# Patient Record
Sex: Male | Born: 2000 | Race: Black or African American | Hispanic: No | Marital: Single | Smoking: Never smoker
Health system: Southern US, Community
[De-identification: ages and names within clinical notes are randomized; demographics above are authoritative.]

## PROBLEM LIST (undated history)

## (undated) DIAGNOSIS — R51 Headache: Secondary | ICD-10-CM

## (undated) DIAGNOSIS — I517 Cardiomegaly: Secondary | ICD-10-CM

## (undated) DIAGNOSIS — R519 Headache, unspecified: Secondary | ICD-10-CM

## (undated) HISTORY — PX: NO PAST SURGERIES: SHX2092

---
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
## 2022-04-20 ENCOUNTER — Emergency Department: Payer: Medicaid Other

## 2022-04-20 ENCOUNTER — Other Ambulatory Visit: Payer: Self-pay

## 2022-04-20 ENCOUNTER — Inpatient Hospital Stay
Admission: EM | Admit: 2022-04-20 | Discharge: 2022-04-22 | DRG: 144 | Disposition: A | Discharge status: Home / Self Care | Payer: Medicaid Other | Attending: Internal Medicine | Admitting: Internal Medicine

## 2022-04-20 DIAGNOSIS — Z9889 Other specified postprocedural states: Secondary | ICD-10-CM

## 2022-04-20 DIAGNOSIS — J029 Acute pharyngitis, unspecified: Secondary | ICD-10-CM

## 2022-04-20 DIAGNOSIS — E860 Dehydration: Secondary | ICD-10-CM | POA: Diagnosis present

## 2022-04-20 DIAGNOSIS — R55 Syncope and collapse: Secondary | ICD-10-CM | POA: Diagnosis present

## 2022-04-20 DIAGNOSIS — J36 Peritonsillar abscess: Principal | ICD-10-CM | POA: Diagnosis present

## 2022-04-20 DIAGNOSIS — E871 Hypo-osmolality and hyponatremia: Secondary | ICD-10-CM | POA: Diagnosis present

## 2022-04-20 DIAGNOSIS — I422 Other hypertrophic cardiomyopathy: Secondary | ICD-10-CM | POA: Diagnosis present

## 2022-04-20 DIAGNOSIS — I421 Obstructive hypertrophic cardiomyopathy: Secondary | ICD-10-CM | POA: Diagnosis present

## 2022-04-20 DIAGNOSIS — I517 Cardiomegaly: Secondary | ICD-10-CM | POA: Diagnosis present

## 2022-04-20 LAB — COMPREHENSIVE METABOLIC PANEL
ALT: 10 U/L (ref 0–44)
AST: 24 U/L (ref 15–41)
Albumin: 3.5 g/dL (ref 3.5–5.0)
Alkaline Phosphatase: 53 U/L (ref 38–126)
Anion gap: 9 (ref 5–15)
BUN: 16 mg/dL (ref 6–20)
CO2: 21 mmol/L — ABNORMAL LOW (ref 22–32)
Calcium: 8.4 mg/dL — ABNORMAL LOW (ref 8.9–10.3)
Chloride: 102 mmol/L (ref 98–111)
Creatinine, Ser: 1.18 mg/dL (ref 0.61–1.24)
GFR, Estimated: >60 mL/min (ref >60)
Glucose, Bld: 104 mg/dL — ABNORMAL HIGH (ref 70–99)
Potassium: 4.5 mmol/L (ref 3.5–5.1)
Sodium: 132 mmol/L — ABNORMAL LOW (ref 135–145)
Total Bilirubin: 1.8 mg/dL — ABNORMAL HIGH (ref 0.3–1.2)
Total Protein: 8.3 g/dL — ABNORMAL HIGH (ref 6.5–8.1)

## 2022-04-20 LAB — CBC WITH DIFFERENTIAL/PLATELET
Abs Immature Granulocytes: 0.12 10*3/uL — ABNORMAL HIGH (ref 0.00–0.07)
Basophils Absolute: 0 10*3/uL (ref 0.0–0.1)
Basophils Relative: 0 %
Eosinophils Absolute: 0 10*3/uL (ref 0.0–0.5)
Eosinophils Relative: 0 %
HCT: 40.1 % (ref 39.0–52.0)
Hemoglobin: 12.8 g/dL — ABNORMAL LOW (ref 13.0–17.0)
Immature Granulocytes: 1 %
Lymphocytes Relative: 5 %
Lymphs Abs: 1.3 10*3/uL (ref 0.7–4.0)
MCH: 27.7 pg (ref 26.0–34.0)
MCHC: 31.9 g/dL (ref 30.0–36.0)
MCV: 86.8 fL (ref 80.0–100.0)
Monocytes Absolute: 2.8 10*3/uL — ABNORMAL HIGH (ref 0.1–1.0)
Monocytes Relative: 11 %
Neutro Abs: 20.4 10*3/uL — ABNORMAL HIGH (ref 1.7–7.7)
Neutrophils Relative %: 83 %
Platelets: 453 10*3/uL — ABNORMAL HIGH (ref 150–400)
RBC: 4.62 MIL/uL (ref 4.22–5.81)
RDW: 12.1 % (ref 11.5–15.5)
WBC: 24.6 10*3/uL — ABNORMAL HIGH (ref 4.0–10.5)
nRBC: 0 % (ref 0.0–0.2)

## 2022-04-20 LAB — LACTIC ACID, PLASMA: Lactic Acid, Venous: 1.5 mmol/L (ref 0.5–1.9)

## 2022-04-20 MED ORDER — SODIUM CHLORIDE 0.9 % IV SOLN
3.0000 g | Freq: Four times a day (QID) | INTRAVENOUS | Status: DC
  Administered 2022-04-20 – 2022-04-22 (×6): 3 g via INTRAVENOUS
  Filled 2022-04-20 (×4): qty 8
  Filled 2022-04-20: qty 3
  Filled 2022-04-20: qty 8
  Filled 2022-04-20: qty 3

## 2022-04-20 MED ORDER — ACETAMINOPHEN 650 MG RE SUPP: 650.0000 mg | Freq: Four times a day (QID) | RECTAL | Status: DC | PRN

## 2022-04-20 MED ORDER — KETOROLAC TROMETHAMINE 30 MG/ML IJ SOLN: 30.0000 mg | Freq: Three times a day (TID) | INTRAMUSCULAR | Status: DC | PRN

## 2022-04-20 MED ORDER — MORPHINE SULFATE (PF) 4 MG/ML IV SOLN
4.0000 mg | INTRAVENOUS | Status: DC | PRN
  Administered 2022-04-20: 4 mg via INTRAVENOUS
  Filled 2022-04-20: qty 1

## 2022-04-20 MED ORDER — ACETAMINOPHEN 325 MG PO TABS: 650.0000 mg | ORAL_TABLET | Freq: Four times a day (QID) | ORAL | Status: DC | PRN

## 2022-04-20 MED ORDER — KETOROLAC TROMETHAMINE 30 MG/ML IJ SOLN
15.0000 mg | Freq: Once | INTRAMUSCULAR | Status: AC
  Administered 2022-04-20: 15 mg via INTRAVENOUS
  Filled 2022-04-20: qty 1

## 2022-04-20 MED ORDER — IOHEXOL 300 MG/ML  SOLN
75.0000 mL | Freq: Once | INTRAMUSCULAR | Status: AC | PRN
  Administered 2022-04-20: 75 mL via INTRAVENOUS

## 2022-04-20 MED ORDER — DEXAMETHASONE SODIUM PHOSPHATE 10 MG/ML IJ SOLN
10.0000 mg | Freq: Three times a day (TID) | INTRAMUSCULAR | Status: DC
  Administered 2022-04-21 – 2022-04-22 (×5): 10 mg via INTRAVENOUS
  Filled 2022-04-20 (×5): qty 1

## 2022-04-20 MED ORDER — ONDANSETRON HCL 4 MG/2ML IJ SOLN
4.0000 mg | Freq: Once | INTRAMUSCULAR | Status: AC
  Administered 2022-04-20: 4 mg via INTRAVENOUS
  Filled 2022-04-20: qty 2

## 2022-04-20 MED ORDER — DEXAMETHASONE SODIUM PHOSPHATE 10 MG/ML IJ SOLN
10.0000 mg | Freq: Once | INTRAMUSCULAR | Status: AC
  Administered 2022-04-20: 10 mg via INTRAVENOUS
  Filled 2022-04-20: qty 1

## 2022-04-20 MED ORDER — ONDANSETRON HCL 4 MG/2ML IJ SOLN: 4.0000 mg | Freq: Four times a day (QID) | INTRAMUSCULAR | Status: DC | PRN

## 2022-04-20 MED ORDER — LIDOCAINE HCL (PF) 4 % IJ SOLN
5.0000 mL | Freq: Once | INTRAMUSCULAR | Status: AC
  Administered 2022-04-20: 5 mL
  Filled 2022-04-20: qty 5

## 2022-04-20 MED ORDER — MORPHINE SULFATE (PF) 2 MG/ML IV SOLN: 2.0000 mg | INTRAVENOUS | Status: DC | PRN

## 2022-04-20 MED ORDER — ONDANSETRON HCL 4 MG PO TABS: 4.0000 mg | ORAL_TABLET | Freq: Four times a day (QID) | ORAL | Status: DC | PRN

## 2022-04-20 MED ORDER — SODIUM CHLORIDE 0.9 % IV SOLN
3.0000 g | Freq: Once | INTRAVENOUS | Status: AC
  Administered 2022-04-20: 3 g via INTRAVENOUS
  Filled 2022-04-20: qty 8

## 2022-04-20 MED ORDER — SODIUM CHLORIDE 0.9 % IV SOLN: INTRAVENOUS | Status: AC

## 2022-04-20 MED ORDER — SODIUM CHLORIDE 0.9 % IV BOLUS
1000.0000 mL | Freq: Once | INTRAVENOUS | Status: AC
  Administered 2022-04-20: 1000 mL via INTRAVENOUS

## 2022-04-20 NOTE — Consult Note (Signed)
Pharmacy Antibiotic Note  Steven [name] is a 22 y.o. male admitted on 04/20/2022 with  a peritonsillar abscess  .  Pharmacy has been consulted for Unasyn dosing.  Plan: Unasyn 3g IV q6h  Monitor clinical picture and renal function F/U C&S, abx deescalation / LOT   Height: 5\' 9"  (175.3 cm) Weight: 65.4 kg (144 lb 1.6 oz) IBW/kg (Calculated) : 70.7  Temp (24hrs), Avg:98.8 F (37.1 C), Min:98.8 F (37.1 C), Max:98.8 F (37.1 C)  Recent Labs  Lab 04/20/22 1654  WBC 24.6*  CREATININE 1.18    Estimated Creatinine Clearance: 93.1 mL/min (by C-G formula based on SCr of 1.18 mg/dL).    No Known Allergies  Antimicrobials this admission: Unasyn 3/16 >>   Dose adjustments this admission:  Microbiology results: Wound/throat cx: pending   Thank you for allowing pharmacy to be a part of this patient's care.  Vick Frees, PharmD 04/20/2022 8:56 PM

## 2022-04-20 NOTE — Assessment & Plan Note (Addendum)
History of childhood LVH Syncope likely vasovagal related to pain vs infection, vs underlying progression of LVH with outflow tract obstruction Patient followed with pediatric cardiology until September 2022 with cardiac MRI was considered Last echo 07/2020 showed mild concentric left ventricular hypertrophy without outflow tract obstruction or symptoms  -Will get updated echocardiogram in view of syncopal episode in the setting of acute infection -Cardiology consult, check orthostatics -Monitor on telemetry.  Will benefit from ZIO monitor at discharge

## 2022-04-20 NOTE — H&P (Addendum)
History and Physical    Patient: Steven [name] [id number] DOB: [birth year] DOA: 04/20/2022 DOS: the patient was seen and examined on 04/20/2022 PCP: Suezanne Jacquet, MD  Patient coming from: Home  Chief Complaint:  Chief Complaint  Patient presents with   Sore Throat    HPI: Steven [name] is a 22 y.o. male with medical history significant for LVH who Presented to the ED with a syncopal episode the setting of progressively worsening sore throat for the past 2 weeks in spite of a 10-day course of amoxicillin from 3/4 to 3/14  .  he tested negative for strep at that time.  Patient as well as father at the bedside contribute to the history., he was doing well this morning when he went to the barber however on his return, he started feeling unwell and patient vomited and while sitting on the edge of the bathtub he started slumping over and his aunt started slapping him on his face to wake up.  He denies difficulty breathing.  He denies hitting his head or sustaining other injury from the syncopal episode.  According to father, patient has seen pediatric cardiologist since 2018 and was pulled out of all sports activities because of his LVH which was found incidentally but he was never symptomatic from it.     ED course and data review: Afebrile with pulse 106 and otherwise normal vitals.  Labs with WBC 24,000, sodium 132 total bilirubin 106 EKG, independently interpreted showing sinus tachycardia at 102 with LVH CT soft tissue neck showed several acute findings related to tonsillitis as follows: IMPRESSION: 1. Findings compatible with acute pharyngitis, tonsillitis and supraglottitis. 2. 3.7 x 3.3 x 5.7 cm left tonsillar/peritonsillar abscess. 3. Retropharyngeal edema without peripheral enhancement to suggest retropharyngeal abscess at this time. 4. Moderate effacement of the oropharyngeal and supraglottic laryngeal airway. 5. Bilateral cervical lymphadenopathy, likely  reactive  Patient underwent I&D of the peritonsillar abscess by the ED provider Dr. Quentin Cornwall with aspiration of 15 mL of purulent material.  Patient had immediate relief of his symptoms.  The emergency room provider spoke with ENT, Dr. Ladene Artist who recommended follow-up in the office however due to significant retropharyngeal edema, observation requested.  He was started on Unasyn and Decadron IV and given a 2 L NS bolus.  Hospitalist consulted for admission   Review of Systems: As mentioned in the history of present illness. All other systems reviewed and are negative.  Past Medical History:  Diagnosis Date   Headache    migraines, rare   Left ventricular hypertrophy    note - Dr Filbert Schilder - [facility] 12/25/15   Premature birth    was not due until Jan.  Stayed in [place] until Jan.   Past Surgical History:  Procedure Laterality Date   ADENOIDECTOMY Bilateral 01/30/2016   Procedure: ADENOIDECTOMY;  Surgeon: Clyde Canterbury, MD;  Location: [facility];  Service: ENT;  Laterality: Bilateral;   MYRINGOTOMY WITH TUBE PLACEMENT Bilateral 01/30/2016   Procedure: MYRINGOTOMY WITH TUBE PLACEMENT;  Surgeon: Clyde Canterbury, MD;  Location: [facility];  Service: ENT;  Laterality: Bilateral;   NO PAST SURGERIES     Social History:  reports that he has never smoked. He has never used smokeless tobacco. He reports current drug use. Drug: Marijuana. No history on file for alcohol use.  No Known Allergies  History reviewed. No pertinent family history.  Prior to Admission medications   Medication Sig Start Date End Date Taking? Authorizing Provider  fluticasone Asencion Islam)  50 MCG/ACT nasal spray Place into both nostrils daily as needed for allergies or rhinitis.    [provider]  HYDROcodone-acetaminophen (NORCO) 5-325 MG tablet Take 1 tablet by mouth every 4 (four) hours as needed for moderate pain. 03/23/19   Merlyn Lot, MD    Physical Exam: Vitals:   04/20/22 1645 04/20/22 1648  04/20/22 1650  BP: 129/71    Pulse:   (!) 106  Resp:   20  Temp:   98.8 F (37.1 C)  TempSrc:   Oral  Weight:  65.4 kg   Height:  5\' 9"  (1.753 m)    Physical Exam Vitals and nursing note reviewed.  Constitutional:      General: He is not in acute distress. HENT:     Head: Normocephalic and atraumatic.  Cardiovascular:     Rate and Rhythm: Normal rate and regular rhythm.     Heart sounds: Normal heart sounds.  Pulmonary:     Effort: Pulmonary effort is normal.     Breath sounds: Normal breath sounds.  Abdominal:     Palpations: Abdomen is soft.     Tenderness: There is no abdominal tenderness.  Lymphadenopathy:     Cervical: Cervical adenopathy present.  Neurological:     Mental Status: Mental status is at baseline.     Labs on Admission: I have personally reviewed following labs and imaging studies  CBC: Recent Labs  Lab 04/20/22 1654  WBC 24.6*  NEUTROABS 20.4*  HGB 12.8*  HCT 40.1  MCV 86.8  PLT 0000000*   Basic Metabolic Panel: Recent Labs  Lab 04/20/22 1654  NA 132*  K 4.5  CL 102  CO2 21*  GLUCOSE 104*  BUN 16  CREATININE 1.18  CALCIUM 8.4*   GFR: Estimated Creatinine Clearance: 93.1 mL/min (by C-G formula based on SCr of 1.18 mg/dL). Liver Function Tests: Recent Labs  Lab 04/20/22 1654  AST 24  ALT 10  ALKPHOS 53  BILITOT 1.8*  PROT 8.3*  ALBUMIN 3.5   No results for input(s): "LIPASE", "AMYLASE" in the last 168 hours. No results for input(s): "AMMONIA" in the last 168 hours. Coagulation Profile: No results for input(s): "INR", "PROTIME" in the last 168 hours. Cardiac Enzymes: No results for input(s): "CKTOTAL", "CKMB", "CKMBINDEX", "TROPONINI" in the last 168 hours. BNP (last 3 results) No results for input(s): "PROBNP" in the last 8760 hours. HbA1C: No results for input(s): "HGBA1C" in the last 72 hours. CBG: No results for input(s): "GLUCAP" in the last 168 hours. Lipid Profile: No results for input(s): "CHOL", "HDL", "LDLCALC",  "TRIG", "CHOLHDL", "LDLDIRECT" in the last 72 hours. Thyroid Function Tests: No results for input(s): "TSH", "T4TOTAL", "FREET4", "T3FREE", "THYROIDAB" in the last 72 hours. Anemia Panel: No results for input(s): "VITAMINB12", "FOLATE", "FERRITIN", "TIBC", "IRON", "RETICCTPCT" in the last 72 hours. Urine analysis: No results found for: "COLORURINE", "APPEARANCEUR", "LABSPEC", "PHURINE", "GLUCOSEU", "HGBUR", "BILIRUBINUR", "KETONESUR", "PROTEINUR", "UROBILINOGEN", "NITRITE", "LEUKOCYTESUR"  Radiological Exams on Admission: CT Soft Tissue Neck W Contrast  Result Date: 04/20/2022 CLINICAL DATA:  Provided history: Epiglottitis or tonsillitis suspected. Sore throat for 2 weeks. Strep diagnosis. Syncopal episode today. EXAM: CT NECK WITH CONTRAST TECHNIQUE: Multidetector CT imaging of the neck was performed using the standard protocol following the bolus administration of intravenous contrast. RADIATION DOSE REDUCTION: This exam was performed according to the departmental dose-optimization program which includes automated exposure control, adjustment of the mA and/or kV according to patient size and/or use of iterative reconstruction technique. CONTRAST:  33mL OMNIPAQUE IOHEXOL 300  MG/ML  SOLN COMPARISON:  No pertinent prior exams available for comparison. FINDINGS: Pharynx and larynx: Prominence of the palatine tonsils (left greater than right), as well as adenoid tonsils. 3.7 x 3.3 x 5.7 cm hypoattenuating/hypoenhancing lesion in the region of the left palatine tonsil compatible with a tonsillar/peritonsillar abscess (for instance as seen on series 2, image 34) (series 5, image 44). Mucosal/submucosal edema within the left oropharynx and left greater than right supraglottic larynx. Retropharyngeal edema without peripheral enhancement to suggest retropharyngeal abscess at this time. Moderate effacement of the oropharyngeal and supraglottic laryngeal airway. Salivary glands: No inflammation, mass, or stone.  Thyroid: Unremarkable. Lymph nodes: Bilateral cervical lymphadenopathy. An index left level 2 lymph node measures 18 mm in short axis (series 2, image 44). Vascular: The major vascular structures of the neck are patent. Limited intracranial: No evidence of an acute intracranial abnormality within the field of view. Visualized orbits: Incompletely imaged. No orbital mass or acute orbital finding at the imaged levels. Mastoids and visualized paranasal sinuses: The frontal sinuses, and portions of the ethmoid air cells, are excluded from the field of view superiorly. No significant paranasal sinus disease or mastoid effusion at the imaged levels. Skeleton: Reversal of the expected cervical lordosis. No acute fracture or aggressive osseous lesion. Upper chest: No consolidation within the imaged lung apices. These results were called by telephone at the time of interpretation on 04/20/2022 at 7:00 pm to provider Merlyn Lot , who verbally acknowledged these results. IMPRESSION: 1. Findings compatible with acute pharyngitis, tonsillitis and supraglottitis. 2. 3.7 x 3.3 x 5.7 cm left tonsillar/peritonsillar abscess. 3. Retropharyngeal edema without peripheral enhancement to suggest retropharyngeal abscess at this time. 4. Moderate effacement of the oropharyngeal and supraglottic laryngeal airway. 5. Bilateral cervical lymphadenopathy, likely reactive. Electronically Signed   By: Kellie Simmering D.O.   On: 04/20/2022 19:07     Data Reviewed: Relevant notes from primary care and specialist visits, past discharge summaries as available in EHR, including Care Everywhere. Prior diagnostic testing as pertinent to current admission diagnoses Updated medications and problem lists for reconciliation ED course, including vitals, labs, imaging, treatment and response to treatment Triage notes, nursing and pharmacy notes and ED provider's notes Notable results as noted in HPI   Assessment and Plan: * Peritonsillar  abscess s/p  incision and drainage Patient had I&D of tonsillar abscess in the ED with aspiration of 15 mL of pus Strep twst two weeks ago was negative Tachycardic with WBC 24,000 with no other sepsis criteria, pending lactic acid Received 2 L fluid bolus in the ED - Continue Unasyn --follow blood culture - Continue Decadron 10 mg IV Q8 -Outpatient follow-up with ENT per conversation between ED Dr. Ladene Artist and Dr. Quentin Cornwall in the ED  Syncope History of childhood LVH Syncope likely vasovagal related to pain vs infection, vs underlying progression of LVH with outflow tract obstruction Patient followed with pediatric cardiology until September 2022 with cardiac MRI was considered Last echo 07/2020 showed mild concentric left ventricular hypertrophy without outflow tract obstruction or symptoms  -Will get updated echocardiogram in view of syncopal episode in the setting of acute infection -Cardiology consult --Discussed with father at bedside  Hyponatremia Sodium 132 S/p 2 L NS in the ED - Will continue IV hydration and check sodium in the a.m.    DVT prophylaxis: none, patient ambulant  Consults: [facility] [place] cardiology, Dr. Rockey Situ  Advance Care Planning: full code  Family Communication: Father at bedside  Disposition Plan: Back to previous home environment  Severity of Illness: The appropriate patient status for this patient is OBSERVATION. Observation status is judged to be reasonable and necessary in order to provide the required intensity of service to ensure the patient's safety. The patient's presenting symptoms, physical exam findings, and initial radiographic and laboratory data in the context of their medical condition is felt to place them at decreased risk for further clinical deterioration. Furthermore, it is anticipated that the patient will be medically stable for discharge from the hospital within 2 midnights of admission.   Author: Athena Masse, MD 04/20/2022 8:42  PM  For on call review [email or web address].

## 2022-04-20 NOTE — ED Notes (Signed)
Suction hooked up at bedside

## 2022-04-20 NOTE — Assessment & Plan Note (Addendum)
Patient had I&D of tonsillar abscess in the ED with aspiration of 15 mL of pus Strep test two weeks ago was negative Tachycardic with WBC 24,000 with no other sepsis criteria Received 2 L fluid bolus in the ED.  He is able to eat now.  Will continue oral nutrition - Continue Unasyn - Continue Decadron 10 mg IV Q8 -Outpatient follow-up with ENT per conversation between ED Dr. Genevive Bi and Dr. Roxan Hockey in the ED.

## 2022-04-20 NOTE — ED Triage Notes (Signed)
Patient states sore throat for 2 weeks; diagnosed with strep. Today had syncopal episode in bedroom. Patient having throat pain 8/10.

## 2022-04-20 NOTE — ED Provider Notes (Signed)
[facility] Provider Note    Event Date/Time   First MD Initiated Contact with Patient 04/20/22 1645     (approximate)   History   Sore Throat   HPI  Steven [name] is a 22 y.o. male to the ER for evaluation of 2 weeks of progressively worsening sore throat recently diagnosed with strep throat put on antibiotics felt some improvement but then having worsening left-sided throat pain with poor p.o. intake over the past several days.  Had near syncopal episode that occurred today.  Denies any head injury.  Does have pain with swallowing.  No cough.     Physical Exam   Triage Vital Signs: ED Triage Vitals  Enc Vitals Group     BP 04/20/22 1645 129/71     Pulse Rate 04/20/22 1650 (!) 106     Resp 04/20/22 1650 20     Temp 04/20/22 1650 98.8 F (37.1 C)     Temp Source 04/20/22 1650 Oral     SpO2 --      Weight 04/20/22 1648 144 lb 1.6 oz (65.4 kg)     Height 04/20/22 1648 5\' 9"  (1.753 m)     Head Circumference --      Peak Flow --      Pain Score --      Pain Loc --      Pain Edu? --      Excl. in [place]? --     Most recent vital signs: Vitals:   04/20/22 1645 04/20/22 1650  BP: 129/71   Pulse:  (!) 106  Resp:  20  Temp:  98.8 F (37.1 C)     Constitutional: Alert  Eyes: Conjunctivae are normal.  Head: Atraumatic. Nose: No congestion/rhinnorhea. Mouth/Throat: Mucous membranes are moist.  Rightward deviation of the uvula with a generous left tonsil and edema.  No stridor but muffled voice. Neck: Painless ROM.  Cardiovascular:   Good peripheral circulation. Respiratory: Normal respiratory effort.  No retractions.  Gastrointestinal: Soft and nontender.  Musculoskeletal:  no deformity Neurologic:  MAE spontaneously. No gross focal neurologic deficits are appreciated.  Skin:  Skin is warm, dry and intact. No rash noted. Psychiatric: Mood and affect are normal. Speech and behavior are normal.    ED Results / Procedures /  Treatments   Labs (all labs ordered are listed, but only abnormal results are displayed) Labs Reviewed  CBC WITH DIFFERENTIAL/PLATELET - Abnormal; Notable for the following components:      Result Value   WBC 24.6 (*)    Hemoglobin 12.8 (*)    Platelets 453 (*)    Neutro Abs 20.4 (*)    Monocytes Absolute 2.8 (*)    Abs Immature Granulocytes 0.12 (*)    All other components within normal limits  COMPREHENSIVE METABOLIC PANEL - Abnormal; Notable for the following components:   Sodium 132 (*)    CO2 21 (*)    Glucose, Bld 104 (*)    Calcium 8.4 (*)    Total Protein 8.3 (*)    Total Bilirubin 1.8 (*)    All other components within normal limits  AEROBIC/ANAEROBIC CULTURE W GRAM STAIN (SURGICAL/DEEP WOUND)     EKG ED ECG REPORT I, Merlyn Lot, the attending physician, personally viewed and interpreted this ECG.   Date: 04/20/2022  EKG Time: 16:51  Rate: 100  Rhythm: sinus  Axis: normal  Intervals: borderline prolonged qt  ST&T Change: nonspecific st abn unchanged from previous  RADIOLOGY  Please see ED Course for my review and interpretation.  I personally reviewed all radiographic images ordered to evaluate for the above acute complaints and reviewed radiology reports and findings.  These findings were personally discussed with the patient.  Please see medical record for radiology report.   PROCEDURES:  Critical Care performed: No  ..Incision and Drainage  Date/Time: 04/20/2022 8:08 PM  Performed by: Merlyn Lot, MD Authorized by: Merlyn Lot, MD   Consent:    Consent obtained:  Verbal   Consent given by:  Patient Location:    Type:  Abscess   Size:  3x5x3   Location:  Mouth   Mouth location:  Peritonsillar Sedation:    Sedation type:  None Anesthesia:    Anesthesia method:  Topical application Procedure type:    Complexity:  Simple Procedure details:    Drainage:  Purulent   Drainage amount:  Copious Post-procedure details:     Procedure completion:  Tolerated    MEDICATIONS ORDERED IN ED: Medications  morphine (PF) 4 MG/ML injection 4 mg (4 mg Intravenous Given 04/20/22 1938)  sodium chloride 0.9 % bolus 1,000 mL (0 mLs Intravenous Stopped 04/20/22 1858)  ketorolac (TORADOL) 30 MG/ML injection 15 mg (15 mg Intravenous Given 04/20/22 1721)  dexamethasone (DECADRON) injection 10 mg (10 mg Intravenous Given 04/20/22 1721)  Ampicillin-Sulbactam (UNASYN) 3 g in sodium chloride 0.9 % 100 mL IVPB (0 g Intravenous Stopped 04/20/22 1827)  sodium chloride 0.9 % bolus 1,000 mL (0 mLs Intravenous Stopped 04/20/22 1858)  iohexol (OMNIPAQUE) 300 MG/ML solution 75 mL (75 mLs Intravenous Contrast Given 04/20/22 1759)  lidocaine (PF) (XYLOCAINE) 4 % (PF) injection 5 mL (5 mLs Other Given 04/20/22 1922)  ondansetron (ZOFRAN) injection 4 mg (4 mg Intravenous Given 04/20/22 1938)     IMPRESSION / MDM / [place] / ED COURSE  I reviewed the triage vital signs and the nursing notes.                              Differential diagnosis includes, but is not limited to, PTA, RPA, dehydration, sepsis, strep throat, angioedema  Patient presenting to the ER for evaluation of symptoms as described above.  Based on symptoms, risk factors and considered above differential, this presenting complaint could reflect a potentially life-threatening illness therefore the patient will be placed on continuous pulse oximetry and telemetry for monitoring.  Laboratory evaluation will be sent to evaluate for the above complaints.      Clinical Course as of 04/20/22 2012  Sat Apr 20, 2022  1814 CT of throat on my review and interpretation does show evidence of what I suspect is a left peritonsillar abscess will await formal radiology report. [PR]  2009 Patient tolerated I&D without complication.  16 cc of purulent fluid were withdrawn.  Sent for culture.  I discussed the case in consultation with Dr. Kathyrn Sheriff of ENT.  Agrees with plan for I&D IV  antibiotics IV steroids.  Given his syncopal episode with significant dehydration with fair amount of edema on CT I do think that admission for observation IV hydration and symptomatic management is indicated.  Will consult hospitalist for admission. [PR]    Clinical Course User Index [PR] Merlyn Lot, MD     FINAL CLINICAL IMPRESSION(S) / ED DIAGNOSES   Final diagnoses:  Peritonsillar abscess  Pharyngitis, unspecified etiology     Rx / DC Orders   ED Discharge Orders  None        Note:  This document was prepared using Dragon voice recognition software and may include unintentional dictation errors.    Merlyn Lot, MD 04/20/22 2012

## 2022-04-20 NOTE — Assessment & Plan Note (Signed)
Sodium 132 S/p 2 L NS in the ED - Will continue IV hydration and check sodium in the a.m.

## 2022-04-21 ENCOUNTER — Observation Stay (HOSPITAL_COMMUNITY)
Admit: 2022-04-21 | Discharge: 2022-04-21 | Disposition: A | Discharge status: Home / Self Care | Payer: Medicaid Other | Attending: Internal Medicine | Admitting: Internal Medicine

## 2022-04-21 DIAGNOSIS — E871 Hypo-osmolality and hyponatremia: Secondary | ICD-10-CM

## 2022-04-21 DIAGNOSIS — J36 Peritonsillar abscess: Secondary | ICD-10-CM | POA: Diagnosis present

## 2022-04-21 DIAGNOSIS — I517 Cardiomegaly: Secondary | ICD-10-CM | POA: Diagnosis not present

## 2022-04-21 DIAGNOSIS — I421 Obstructive hypertrophic cardiomyopathy: Secondary | ICD-10-CM

## 2022-04-21 DIAGNOSIS — E860 Dehydration: Secondary | ICD-10-CM | POA: Diagnosis present

## 2022-04-21 DIAGNOSIS — R55 Syncope and collapse: Secondary | ICD-10-CM

## 2022-04-21 DIAGNOSIS — I422 Other hypertrophic cardiomyopathy: Secondary | ICD-10-CM | POA: Diagnosis present

## 2022-04-21 LAB — BASIC METABOLIC PANEL
Anion gap: 7 (ref 5–15)
BUN: 15 mg/dL (ref 6–20)
CO2: 21 mmol/L — ABNORMAL LOW (ref 22–32)
Calcium: 8.4 mg/dL — ABNORMAL LOW (ref 8.9–10.3)
Chloride: 104 mmol/L (ref 98–111)
Creatinine, Ser: 0.91 mg/dL (ref 0.61–1.24)
GFR, Estimated: >60 mL/min (ref >60)
Glucose, Bld: 137 mg/dL — ABNORMAL HIGH (ref 70–99)
Potassium: 4.2 mmol/L (ref 3.5–5.1)
Sodium: 132 mmol/L — ABNORMAL LOW (ref 135–145)

## 2022-04-21 LAB — CBC
HCT: 38.2 % — ABNORMAL LOW (ref 39.0–52.0)
Hemoglobin: 12.3 g/dL — ABNORMAL LOW (ref 13.0–17.0)
MCH: 27.3 pg (ref 26.0–34.0)
MCHC: 32.2 g/dL (ref 30.0–36.0)
MCV: 84.9 fL (ref 80.0–100.0)
Platelets: 459 10*3/uL — ABNORMAL HIGH (ref 150–400)
RBC: 4.5 MIL/uL (ref 4.22–5.81)
RDW: 11.9 % (ref 11.5–15.5)
WBC: 23.4 10*3/uL — ABNORMAL HIGH (ref 4.0–10.5)
nRBC: 0 % (ref 0.0–0.2)

## 2022-04-21 LAB — ECHOCARDIOGRAM COMPLETE
AR max vel: 2.13 cm2
AV Area VTI: 2.11 cm2
AV Area mean vel: 1.97 cm2
AV Mean grad: 7 mmHg
AV Peak grad: 12.7 mmHg
Ao pk vel: 1.78 m/s
Area-P 1/2: 3.68 cm2
Calc EF: 85.3 %
Height: 69 in
S' Lateral: 2.3 cm
Single Plane A2C EF: 90.7 %
Single Plane A4C EF: 79.2 %
Weight: 2305.6 oz

## 2022-04-21 LAB — LACTIC ACID, PLASMA: Lactic Acid, Venous: 1.2 mmol/L (ref 0.5–1.9)

## 2022-04-21 LAB — HIV ANTIBODY (ROUTINE TESTING W REFLEX): HIV Screen 4th Generation wRfx: NONREACTIVE

## 2022-04-21 MED ORDER — PERFLUTREN LIPID MICROSPHERE
1.0000 mL | INTRAVENOUS | Status: AC | PRN
  Administered 2022-04-21: 3 mL via INTRAVENOUS

## 2022-04-21 MED ORDER — ORAL CARE MOUTH RINSE: 15.0000 mL | OROMUCOSAL | Status: DC | PRN

## 2022-04-21 NOTE — Assessment & Plan Note (Signed)
Recheck echo and cardiology consult

## 2022-04-21 NOTE — Progress Notes (Signed)
  Progress Note   Patient: Steven [name] [id number] DOB: 09-03-2000 DOA: 04/20/2022     0 DOS: the patient was seen and examined on 04/21/2022   Brief hospital course: 22 y.o. male with medical history significant for LVH who Presented to the ED with a syncopal episode the setting of progressively worsening sore throat for the past 2 weeks in spite of a 10-day course of amoxicillin from 3/4 to 3/14  .  he tested negative for strep at that time.  His peritonsillar abscess was drained in the ED.  3/17: Cardiology consult, echo pending, orthostatic vitals, IV Decadron and Unasyn  Assessment and Plan: * Peritonsillar abscess s/p  incision and drainage Patient had I&D of tonsillar abscess in the ED with aspiration of 15 mL of pus Strep test two weeks ago was negative Tachycardic with WBC 24,000 with no other sepsis criteria Received 2 L fluid bolus in the ED.  He is able to eat now.  Will continue oral nutrition - Continue Unasyn - Continue Decadron 10 mg IV Q8 -Outpatient follow-up with ENT per conversation between ED Dr. Ladene Artist and Dr. Quentin Cornwall in the ED.  Syncope History of childhood LVH Syncope likely vasovagal related to pain vs infection, vs underlying progression of LVH with outflow tract obstruction Patient followed with pediatric cardiology until September 2022 with cardiac MRI was considered Last echo 07/2020 showed mild concentric left ventricular hypertrophy without outflow tract obstruction or symptoms  -Will get updated echocardiogram in view of syncopal episode in the setting of acute infection -Cardiology consult, check orthostatics -Monitor on telemetry.  Will benefit from ZIO monitor at discharge  Hyponatremia Sodium 132 S/p 2 L NS in the ED - Continue IV hydration and encourage oral nutrition  LVH (left ventricular hypertrophy) Recheck echo and cardiology consult        Subjective: Feeling much better  Physical Exam: Vitals:   04/20/22 2030  04/20/22 2201 04/21/22 0459 04/21/22 0714  BP: 134/72 (!) 144/77 117/61 120/62  Pulse: 92 88 (!) 57 75  Resp: 16 19 19 15   Temp:  98.7 F (37.1 C) 98.1 F (36.7 C) 98.1 F (36.7 C)  TempSrc:      SpO2: 95% 95% 96% 100%  Weight:      Height:       22 year old male sitting in the bed comfortably without any acute distress Cervical lymphadenopathy present Lungs clear to auscultation bilaterally Heart regular rate and rhythm Abdomen soft, benign Neuro alert and awake, nonfocal Skin no rash or lesion Psych normal mood and affect Data Reviewed:  Sodium 132, WBC 23.4  Family Communication: None at bedside  Disposition: Status is: Inpatient Remains inpatient appropriate because: Peritonsillar abscess management, syncope workup   Planned Discharge Destination: Home   DVT prophylaxis-none patient is very young and ambulatory, low risk for DVT Time spent: 35 minutes  Author: Max Sane, MD 04/21/2022 11:44 AM  For on call review [email or web address].

## 2022-04-21 NOTE — Consult Note (Signed)
Cardiology Consultation   Patient ID: Aizen Malo MRN: [id number]; DOB: 2001/01/06  Admit date: 04/20/2022 Date of Consult: 04/21/2022  PCP:  Suezanne Jacquet, MD   [place] Providers Cardiologist:  None   {   Patient Profile:   Steven [name] is a 22 y.o. male with a hx of hypertrophic CM followed by [name] who is being seen 04/21/2022 for the evaluation of syncope and LVH at the request of Dr. Manuella Ghazi.  History of Present Illness:   Mr. Olivar has been followed by [facility] for LVH. Echo in 2021 showed normal LVSF, mild MR, mild asymmetric LVH, trivial systolic anterior displacement of chordal apparatus of the mitral valve, causing minimal late systolic dynamic left ventricular outflow tract obstruction. Heart monitor in 06/2019 showed NSR with an average heart rate of 69bpm, 479 isolated ventricular ectopics, 11 isolated ventricular ectopics, 104 triggered events. Echo 07/2020 showed normal LVSF, mild TR, increased left ventricular hypertrophy seen in LV apex with minimal flow acceleration, trivial systolic anterior displacement of chordal apparatus of the mitral valve, causing dynamic left ventricular outflow tract obstruction. cMRI in 09/2020 showed mid ventricular and apical hyper trophic CM with wall thickness up to 2.5cm, hyperdynamic systolic function [id number] near obliteration of the apical and mid ventricle, patchy late gadolinium enhancement suggestive of myocardial fibrosis involving the apical septal, distal mid septal, mid anterior and apical lateral segments. Patient says he follows up yearly with cardiology.   The patient presented to [facility] ED 04/20/22 with syncope and sore throat. Patient had a sore throat 2 weeks ago and was treated with amoxicillin from 3/4 to 3/14. He was strep negative at the time. Sore throat initially went away, and then returned and it was sore on both side. Pt also had a syncopal episode. the patient was seated and started feeling  unwell and vomited. He felt lightheaded as well. He slumped over and lost consciousness. His aunt was there who woke him up. The patient reports he was not out long. He denies chest pain, SOB, LLE. Or significant palpitations.   In the ER BP 129/71, pulse 106bpm, RR 20, afebrile. Labs showed WBC 24.6, Hgb 12.8, sodium 132, Scr 1.18, BUN 16, CO2 21.CT of the throat showed a left peritonsillar abscess and he underwent I&D. EKG shows ST with, 102bpm, LVH with repol abnormality. The patient was admitted for further work-up.   Past Medical History:  Diagnosis Date   Headache    migraines, rare   Left ventricular hypertrophy    note - Dr Filbert Schilder - [facility] 12/25/15   Premature birth    was not due until Jan.  Stayed in [place] until Jan.    Past Surgical History:  Procedure Laterality Date   ADENOIDECTOMY Bilateral 01/30/2016   Procedure: ADENOIDECTOMY;  Surgeon: Clyde Canterbury, MD;  Location: [place];  Service: ENT;  Laterality: Bilateral;   MYRINGOTOMY WITH TUBE PLACEMENT Bilateral 01/30/2016   Procedure: MYRINGOTOMY WITH TUBE PLACEMENT;  Surgeon: Clyde Canterbury, MD;  Location: [place];  Service: ENT;  Laterality: Bilateral;   NO PAST SURGERIES       Home Medications:  Prior to Admission medications   Medication Sig Start Date End Date Taking? Authorizing Provider  amoxicillin (AMOXIL) 500 MG capsule Take 500 mg by mouth 2 (two) times daily. 04/08/22  Yes [provider]  fluticasone (FLONASE) 50 MCG/ACT nasal spray Place into both nostrils daily as needed for allergies or rhinitis.   Yes [provider]  HYDROcodone-acetaminophen (  NORCO) 5-325 MG tablet Take 1 tablet by mouth every 4 (four) hours as needed for moderate pain. 03/23/19  Yes Merlyn Lot, MD  ibuprofen (ADVIL) 600 MG tablet Take 600 mg by mouth every 6 (six) hours as needed.   Yes [provider]    Inpatient Medications: Scheduled Meds:  dexamethasone (DECADRON) injection  10 mg  Intravenous Q8H   Continuous Infusions:  sodium chloride Stopped (04/21/22 0535)   ampicillin-sulbactam (UNASYN) IV 200 mL/hr at 04/21/22 0553   PRN Meds: acetaminophen **OR** acetaminophen, ketorolac, morphine injection, ondansetron **OR** ondansetron (ZOFRAN) IV  Allergies:   No Known Allergies  Social History:   Social History   Socioeconomic History   Marital status: Single    Spouse name: Not on file   Number of children: Not on file   Years of education: Not on file   Highest education level: Not on file  Occupational History   Not on file  Tobacco Use   Smoking status: Never   Smokeless tobacco: Never  Substance and Sexual Activity   Alcohol use: Not on file   Drug use: Yes    Types: Marijuana   Sexual activity: Not on file  Other Topics Concern   Not on file  Social History Narrative   Not on file   Social Determinants of Health   Financial Resource Strain: Not on file  Food Insecurity: No Food Insecurity (04/20/2022)   Hunger Vital Sign    Worried About Running Out of Food in the Last Year: Never true    Ran Out of Food in the Last Year: Never true  Transportation Needs: No Transportation Needs (04/20/2022)   PRAPARE - Hydrologist (Medical): No    Lack of Transportation (Non-Medical): No  Physical Activity: Not on file  Stress: Not on file  Social Connections: Not on file  Intimate Partner Violence: Not At Risk (04/20/2022)   Humiliation, Afraid, Rape, and Kick questionnaire    Fear of Current or Ex-Partner: No    Emotionally Abused: No    Physically Abused: No    Sexually Abused: No    Family History:   History reviewed. No pertinent family history.   ROS:  Please see the history of present illness.   All other ROS reviewed and negative.     Physical Exam/Data:   Vitals:   04/20/22 2030 04/20/22 2201 04/21/22 0459 04/21/22 0714  BP: 134/72 (!) 144/77 117/61 120/62  Pulse: 92 88 (!) 57 75  Resp: 16 19 19 15    Temp:  98.7 F (37.1 C) 98.1 F (36.7 C) 98.1 F (36.7 C)  TempSrc:      SpO2: 95% 95% 96% 100%  Weight:      Height:        Intake/Output Summary (Last 24 hours) at 04/21/2022 0719 Last data filed at 04/21/2022 0553 Gross per 24 hour  Intake 3171.9 ml  Output --  Net 3171.9 ml      04/20/2022    4:48 PM 03/23/2019    5:17 PM 01/30/2016    8:02 AM  Last 3 Weights  Weight (lbs) 144 lb 1.6 oz 168 lb 122 lb  Weight (kg) 65.363 kg 76.204 kg 55.339 kg     Body mass index is 21.28 kg/m.  General:  Well nourished, well developed, in no acute distress HEENT: normal Neck: no JVD Vascular: No carotid bruits; Distal pulses 2+ bilaterally Cardiac:  normal S1, S2; RRR; no murmur  Lungs:  clear  to auscultation bilaterally, no wheezing, rhonchi or rales  Abd: soft, nontender, no hepatomegaly  Ext: no edema Musculoskeletal:  No deformities, BUE and BLE strength normal and equal Skin: warm and dry  Neuro:  CNs 2-12 intact, no focal abnormalities noted Psych:  Normal affect   EKG:  The EKG was personally reviewed and demonstrates:  ST, 102bpm, LVH with repol abnormality Telemetry:  Telemetry was personally reviewed and demonstrates:  NSR/SB HR 60s, down to upper 40s  Relevant CV Studies:  cMRI 09/2020 Exam:  MRI Cardiac Morph W Wo Contrast   Impressions:  Mid ventricular and apical hypertrophic cardiomyopathy with wall thickness  up to 2.5 cm  Hyperdynamic  LV Systolic function EF = [id number] with systolic near  obliteration of the apical and mid ventricle  Patchy late gadolinium enhancement suggestive of myocardial fibrosis  involving the apical septal, distal mid septal, mid anterior and apical  lateral segments  No significant valvular abnormality  Normal right ventricular size and systolic function   Echo [id number]  Summary:   1. Mild tricuspid valve regurgitation.   2. Right ventricular systolic pressure estimate = 27.2 mmHg.   3. Mildly hypertrophied left ventricle.   4.  Increased left ventricular hypertrophy seen in LV apex with minimal flow acceleration.   5. Normal left ventricular systolic function.   6. Trivial systolic anterior displacement of chordal apparatus of the mitral valve, causing dynamic left ventricular outflow tract obstruction.   7. Normal right ventricular cavity size and systolic function.   8. Normal aorta.   9. No pericardial effusion.   Echo 04/2019 Summary:   1. Mild tricuspid valve regurgitation.   2. Mildly hypertrophied left ventricle.   3. Mild asymmetry of LVH with IVS/PW ratio 1.2.   4. Normal left ventricular systolic function.   5. Trivial systolic anterior displacement of chordal apparatus of the mitral valve, causing minimal late systolic dynamic left ventricular outflow tract obstruction.   6. Right ventricular systolic pressure estimate = 23.5 mmHg.   Laboratory Data:  High Sensitivity Troponin:  No results for input(s): "TROPONINIHS" in the last 720 hours.   Chemistry Recent Labs  Lab 04/20/22 1654 04/21/22 0018  NA 132* 132*  K 4.5 4.2  CL 102 104  CO2 21* 21*  GLUCOSE 104* 137*  BUN 16 15  CREATININE 1.18 0.91  CALCIUM 8.4* 8.4*  GFRNONAA >60 >60  ANIONGAP 9 7    Recent Labs  Lab 04/20/22 1654  PROT 8.3*  ALBUMIN 3.5  AST 24  ALT 10  ALKPHOS 53  BILITOT 1.8*   Lipids No results for input(s): "CHOL", "TRIG", "HDL", "LABVLDL", "LDLCALC", "CHOLHDL" in the last 168 hours.  Hematology Recent Labs  Lab 04/20/22 1654 04/21/22 0018  WBC 24.6* 23.4*  RBC 4.62 4.50  HGB 12.8* 12.3*  HCT 40.1 38.2*  MCV 86.8 84.9  MCH 27.7 27.3  MCHC 31.9 32.2  RDW 12.1 11.9  PLT 453* 459*   Thyroid No results for input(s): "TSH", "FREET4" in the last 168 hours.  BNPNo results for input(s): "BNP", "PROBNP" in the last 168 hours.  DDimer No results for input(s): "DDIMER" in the last 168 hours.   Radiology/Studies:  CT Soft Tissue Neck W Contrast  Result Date: 04/20/2022 CLINICAL DATA:  Provided history:  Epiglottitis or tonsillitis suspected. Sore throat for 2 weeks. Strep diagnosis. Syncopal episode today. EXAM: CT NECK WITH CONTRAST TECHNIQUE: Multidetector CT imaging of the neck was performed using the standard protocol following the bolus administration of intravenous contrast.  RADIATION DOSE REDUCTION: This exam was performed according to the departmental dose-optimization program which includes automated exposure control, adjustment of the mA and/or kV according to patient size and/or use of iterative reconstruction technique. CONTRAST:  38mL OMNIPAQUE IOHEXOL 300 MG/ML  SOLN COMPARISON:  No pertinent prior exams available for comparison. FINDINGS: Pharynx and larynx: Prominence of the palatine tonsils (left greater than right), as well as adenoid tonsils. 3.7 x 3.3 x 5.7 cm hypoattenuating/hypoenhancing lesion in the region of the left palatine tonsil compatible with a tonsillar/peritonsillar abscess (for instance as seen on series 2, image 34) (series 5, image 44). Mucosal/submucosal edema within the left oropharynx and left greater than right supraglottic larynx. Retropharyngeal edema without peripheral enhancement to suggest retropharyngeal abscess at this time. Moderate effacement of the oropharyngeal and supraglottic laryngeal airway. Salivary glands: No inflammation, mass, or stone. Thyroid: Unremarkable. Lymph nodes: Bilateral cervical lymphadenopathy. An index left level 2 lymph node measures 18 mm in short axis (series 2, image 44). Vascular: The major vascular structures of the neck are patent. Limited intracranial: No evidence of an acute intracranial abnormality within the field of view. Visualized orbits: Incompletely imaged. No orbital mass or acute orbital finding at the imaged levels. Mastoids and visualized paranasal sinuses: The frontal sinuses, and portions of the ethmoid air cells, are excluded from the field of view superiorly. No significant paranasal sinus disease or mastoid effusion at  the imaged levels. Skeleton: Reversal of the expected cervical lordosis. No acute fracture or aggressive osseous lesion. Upper chest: No consolidation within the imaged lung apices. These results were called by telephone at the time of interpretation on 04/20/2022 at 7:00 pm to provider Merlyn Lot , who verbally acknowledged these results. IMPRESSION: 1. Findings compatible with acute pharyngitis, tonsillitis and supraglottitis. 2. 3.7 x 3.3 x 5.7 cm left tonsillar/peritonsillar abscess. 3. Retropharyngeal edema without peripheral enhancement to suggest retropharyngeal abscess at this time. 4. Moderate effacement of the oropharyngeal and supraglottic laryngeal airway. 5. Bilateral cervical lymphadenopathy, likely reactive. Electronically Signed   By: Kellie Simmering D.O.   On: 04/20/2022 19:07     Assessment and Plan:   Syncope - syncope in the setting of peritonsillar abscess s/p I&D, dehydration with underlying LVH, possibly vasovagal - s/p IVF - EKG with ST and LVH with repol abnormalities - tele with no arrhythmias - check orthostatics - check echo - heart monitor at discharge. Given syncope in the setting of LVH may need to see EP to discuss defibrillator.  LVH - echo in 07/2020 showed normal LVSF with mild LVH, increased left LVH seen in LV apex with minimal flow acceleration, trivial systolic anterior displacement of chordal apparatus of the mitral valve, causing dynamic left ventricular outflow tract obstruction. - cMRI 09/2020 showed mid ventricular and apical hypertrophic CM with wall thickness up to 2.5cm, hyperdynamic LVSF at 72% with near obliteration of the apical and mid ventricle - unable to see the notes and what the plan, patient says he follows up annually - re-check echo as above  Peritonsillar abscess s/p I&D - abx per IM  For questions or updates, please contact [place] Please consult [email or web address] for contact info under    Signed, Clinten Howk Ninfa Meeker, PA-C   04/21/2022 7:19 AM

## 2022-04-21 NOTE — Hospital Course (Signed)
23 y.o. male with medical history significant for LVH who Presented to the ED with a syncopal episode the setting of progressively worsening sore throat for the past 2 weeks in spite of a 10-day course of amoxicillin from 3/4 to 3/14  .  he tested negative for strep at that time.  His peritonsillar abscess was drained in the ED.  3/17: Cardiology consult, echo pending, orthostatic vitals, IV Decadron and Unasyn

## 2022-04-22 DIAGNOSIS — J029 Acute pharyngitis, unspecified: Secondary | ICD-10-CM

## 2022-04-22 LAB — BASIC METABOLIC PANEL
Anion gap: 8 (ref 5–15)
BUN: 18 mg/dL (ref 6–20)
CO2: 25 mmol/L (ref 22–32)
Calcium: 8.7 mg/dL — ABNORMAL LOW (ref 8.9–10.3)
Chloride: 106 mmol/L (ref 98–111)
Creatinine, Ser: 0.81 mg/dL (ref 0.61–1.24)
GFR, Estimated: >60 mL/min (ref >60)
Glucose, Bld: 135 mg/dL — ABNORMAL HIGH (ref 70–99)
Potassium: 4.2 mmol/L (ref 3.5–5.1)
Sodium: 137 mmol/L (ref 135–145)

## 2022-04-22 LAB — CBC
HCT: 37 % — ABNORMAL LOW (ref 39.0–52.0)
Hemoglobin: 12 g/dL — ABNORMAL LOW (ref 13.0–17.0)
MCH: 27.5 pg (ref 26.0–34.0)
MCHC: 32.4 g/dL (ref 30.0–36.0)
MCV: 84.9 fL (ref 80.0–100.0)
Platelets: 505 10*3/uL — ABNORMAL HIGH (ref 150–400)
RBC: 4.36 MIL/uL (ref 4.22–5.81)
RDW: 11.9 % (ref 11.5–15.5)
WBC: 16 10*3/uL — ABNORMAL HIGH (ref 4.0–10.5)
nRBC: 0 % (ref 0.0–0.2)

## 2022-04-22 MED ORDER — AMOXICILLIN 500 MG PO CAPS: 500.0000 mg | ORAL_CAPSULE | Freq: Two times a day (BID) | ORAL | 0 refills | Status: AC

## 2022-04-22 MED ORDER — PREDNISONE 10 MG (21) PO TBPK: ORAL_TABLET | ORAL | 0 refills | Status: DC

## 2022-04-22 NOTE — TOC Initial Note (Signed)
Transition of Care [facility]) - Initial/Assessment Note    Patient Details  Name: Steven [name] MRN: [id number] Date of Birth: August 24, 2000  [facility] CM/SW Contact:    Beverly Sessions, RN Phone Number: 04/22/2022, 10:23 AM  Clinical Narrative:                   Transition of Care [facility]) Screening Note   Patient Details  Name: Steven [name] Date of Birth: 01/04/2001   Transition of Care [facility]) CM/SW Contact:    Beverly Sessions, RN Phone Number: 04/22/2022, 10:23 AM    Transition of Care Department (TOC) has reviewed patient and no TOC needs have been identified at this time. We will continue to monitor patient advancement through interdisciplinary progression rounds. If new patient transition needs arise, please place a TOC consult.         Patient Goals and CMS Choice            Expected Discharge Plan and Services         Expected Discharge Date: 04/22/22                                    Prior Living Arrangements/Services                       Activities of Daily Living Home Assistive Devices/Equipment: None ADL Screening (condition at time of admission) Patient's cognitive ability adequate to safely complete daily activities?: Yes Is the patient deaf or have difficulty hearing?: No Does the patient have difficulty seeing, even when wearing glasses/contacts?: No Does the patient have difficulty concentrating, remembering, or making decisions?: No Patient able to express need for assistance with ADLs?: No Does the patient have difficulty dressing or bathing?: No Independently performs ADLs?: Yes (appropriate for developmental age) Does the patient have difficulty walking or climbing stairs?: No Weakness of Legs: None Weakness of Arms/Hands: None  Permission Sought/Granted                  Emotional Assessment              Admission diagnosis:  Peritonsillar abscess [J36] Pharyngitis,  unspecified etiology [J02.9] Patient Active Problem List   Diagnosis Date Noted   Pharyngitis 04/22/2022   Obstructive hypertrophic cardiomyopathy ([place]) 04/21/2022   Peritonsillar abscess s/p  incision and drainage 04/20/2022   H/O peritonsillar abscess drainage 04/20/2022   LVH (left ventricular hypertrophy) 04/20/2022   Syncope 04/20/2022   Hyponatremia 04/20/2022   PCP:  Suezanne Jacquet, MD Pharmacy:   [facility] [address] [place]) [place] [address] Phone: [phone] Fax: [phone]     Social Determinants of Health (SDOH) Social History: SDOH Screenings   Food Insecurity: No Food Insecurity (04/20/2022)  Housing: Low Risk  (04/20/2022)  Transportation Needs: No Transportation Needs (04/20/2022)  Utilities: Not At Risk (04/20/2022)  Tobacco Use: Low Risk  (04/20/2022)   SDOH Interventions: Food Insecurity Interventions: Intervention Not Indicated Housing Interventions: Intervention Not Indicated Transportation Interventions: Intervention Not Indicated Utilities Interventions: Intervention Not Indicated   Readmission Risk Interventions     No data to display

## 2022-04-24 LAB — AEROBIC/ANAEROBIC CULTURE W GRAM STAIN (SURGICAL/DEEP WOUND)

## 2022-04-24 NOTE — Discharge Summary (Signed)
Physician Discharge Summary   Patient: Steven [name] MRN: [id number] DOB: 16-Mar-2000  Admit date:     04/20/2022  Discharge date: 04/22/2022  Discharge Physician: Steven [name]   PCP: Steven Jacquet, MD   Recommendations at discharge:    F/up with outpt providers as requested  Discharge Diagnoses: Principal Problem:   Peritonsillar abscess s/p  incision and drainage Active Problems:   Syncope   Hyponatremia   LVH (left ventricular hypertrophy)   H/O peritonsillar abscess drainage   Obstructive hypertrophic cardiomyopathy Steven [name])   Pharyngitis  [name] Course: 22 y.o. male with medical history significant for LVH who Presented to the ED with a syncopal episode the setting of progressively worsening sore throat for the past 2 weeks in spite of a 10-day course of amoxicillin from 3/4 to 3/14  .  he tested negative for strep at that time.  His peritonsillar abscess was drained in the ED.  3/17: Cardiology consult, echo pending, orthostatic vitals, IV Decadron and Unasyn  Assessment and Plan: * Peritonsillar abscess s/p  incision and drainage Patient had I&D of tonsillar abscess in the ED with aspiration of 15 mL of pus Strep test two weeks ago was negative Tachycardic with WBC 24,000 with no other sepsis criteria Treated with IV Abx and Steroids with good response. -Outpatient follow-up with ENT Dr. Ladene [name]   Syncope History of childhood LVH Hypertrophic cardiomyopathy In the setting of tonsillar abscess, coughing then vomiting, hypertrophic cardiomyopathy with near cavity obliteration on echo in systole Unable to exclude vasovagal event At baseline is typically asymptomatic, no significant history of syncope, this event likely situational -No further cardiac testing needed per cardio -given IV fluids while inpt, blood pressure stable, has been walking around the unit asymptomatic -Recommend follow-up with outpatient cardiology at [name] - echo this admission shows mid  ventricle towards the apical region moderate to severe Near cavity obliteration in systole, hyperdynamic Appears unchanged from prior outpatient studies  Hyponatremia Sodium 132 Improved with hydration       Consultants: Cardio Disposition: Home Diet recommendation:  Discharge Diet Orders (From admission, onward)     Start     Ordered   04/22/22 0000  Diet - low sodium heart healthy        04/22/22 0739           Carb modified diet DISCHARGE MEDICATION: Allergies as of 04/22/2022   No Known Allergies      Medication List     TAKE these medications    amoxicillin 500 MG capsule Commonly known as: AMOXIL Take 1 capsule (500 mg total) by mouth 2 (two) times daily for 10 days.   fluticasone 50 MCG/ACT nasal spray Commonly known as: FLONASE Place into both nostrils daily as needed for allergies or rhinitis.   HYDROcodone-acetaminophen 5-325 MG tablet Commonly known as: Norco Take 1 tablet by mouth every 4 (four) hours as needed for moderate pain.   ibuprofen 600 MG tablet Commonly known as: ADVIL Take 600 mg by mouth every 6 (six) hours as needed.   predniSONE 10 MG (21) Tbpk tablet Commonly known as: STERAPRED UNI-PAK 21 TAB Start 60 mg po daily, taper 10 mg daily until finish        Follow-up Information     Steven Jacquet, MD. Go on 04/24/2022.   Specialty: Pediatrics Why: Appointment on 04/24/22 @ 2:00 pm Contact information: [id number] [place] [address] [phone]         Steven Canterbury, MD. Daphane Shepherd  on 04/29/2022.   Specialty: Otolaryngology Why: Appointment on 04/29/22 @ 1:30 pm.  Please come 15 minutes early to appointment with insurabnce card, photo ID, and list of medications. Contact information: Steven [name] [address] [phone]                Discharge Exam: Danley Danker Weights   04/20/22 1648  Weight: 32.9 kg   22 year old male sitting in the bed comfortably without any  acute distress Cervical lymphadenopathy present Lungs clear to auscultation bilaterally Heart regular rate and rhythm Abdomen soft, benign Neuro alert and awake, nonfocal Skin no rash or lesion Psych normal mood and affect  Condition at discharge: good  The results of significant diagnostics from this hospitalization (including imaging, microbiology, ancillary and laboratory) are listed below for reference.   Imaging Studies: ECHOCARDIOGRAM COMPLETE  Result Date: 04/21/2022    ECHOCARDIOGRAM REPORT   Patient Name:   Steven [name] Date of Exam: 04/21/2022 Medical Rec #:  [id number]               Height:       69.0 in Accession #:    [id number]              Weight:       144.1 lb Date of Birth:  [birth year]               BSA:          1.797 m Patient Age:    21 years                BP:           117/61 mmHg Patient Gender: M                       HR:           62 bpm. Exam Location:  [facility] Procedure: 2D Echo, Color Doppler, Cardiac Doppler and Intracardiac            Opacification Agent Indications:     Syncope R55  History:         Patient has no prior history of Echocardiogram examinations.                  Left Ventricular Hypertrophy.  Sonographer:     L. Thornton-Maynard Referring Phys:  [id number] Steven [name] Diagnosing Phys: Steven Rogue MD IMPRESSIONS  1. Left ventricular ejection fraction, by estimation, is 60 to 65%. The left ventricle has normal function. The left ventricle has no regional wall motion abnormalities. There is moderate to severe concentric left ventricular hypertrophy of the mid ventrical to apical region. Left ventricular diastolic parameters are indeterminate. LVOT gradient 20 mm Hg  2. Right ventricular systolic function is normal. The right ventricular size is normal. There is normal pulmonary artery systolic pressure. The estimated right ventricular systolic pressure is [id number] mmHg.  3. The mitral valve is normal in structure. Mild to moderate mitral valve  regurgitation. No evidence of mitral stenosis.  4. Tricuspid valve regurgitation is moderate.  5. The aortic valve is tricuspid. Aortic valve regurgitation is not visualized. No aortic stenosis is present.  6. The inferior vena cava is normal in size with greater than 50% respiratory variability, suggesting right atrial pressure of 3 mmHg. FINDINGS  Left Ventricle: Left ventricular ejection fraction, by estimation, is 60 to 65%. The left ventricle has normal function. The left ventricle has no regional wall motion  abnormalities. Definity contrast agent was given IV to delineate the left ventricular  endocardial borders. The left ventricular internal cavity size was normal in size. There is moderate concentric left ventricular hypertrophy of the apical segment. Left ventricular diastolic parameters are indeterminate. Right Ventricle: The right ventricular size is normal. No increase in right ventricular wall thickness. Right ventricular systolic function is normal. There is normal pulmonary artery systolic pressure. The tricuspid regurgitant velocity is 2.46 m/s, and  with an assumed right atrial pressure of 3 mmHg, the estimated right ventricular systolic pressure is [id number] mmHg. Left Atrium: Left atrial size was normal in size. Right Atrium: Right atrial size was normal in size. Pericardium: There is no evidence of pericardial effusion. Mitral Valve: The mitral valve is normal in structure. Mild to moderate mitral valve regurgitation. No evidence of mitral valve stenosis. Tricuspid Valve: The tricuspid valve is normal in structure. Tricuspid valve regurgitation is moderate . No evidence of tricuspid stenosis. Aortic Valve: The aortic valve is tricuspid. Aortic valve regurgitation is not visualized. No aortic stenosis is present. Aortic valve mean gradient measures 7.0 mmHg. Aortic valve peak gradient measures 12.7 mmHg. Aortic valve area, by VTI measures 2.11  cm. Pulmonic Valve: The pulmonic valve was normal in  structure. Pulmonic valve regurgitation is mild. No evidence of pulmonic stenosis. Aorta: The aortic root is normal in size and structure. Venous: The inferior vena cava is normal in size with greater than 50% respiratory variability, suggesting right atrial pressure of 3 mmHg. IAS/Shunts: No atrial level shunt detected by color flow Doppler.  LEFT VENTRICLE PLAX 2D LVIDd:         4.60 cm      Diastology LVIDs:         2.30 cm      LV e' medial:    7.51 cm/s LV PW:         1.30 cm      LV E/e' medial:  11.2 LV IVS:        1.50 cm      LV e' lateral:   12.30 cm/s LVOT diam:     1.90 cm      LV E/e' lateral: 6.9 LV SV:         79 LV SV Index:   44 LVOT Area:     2.84 cm  LV Volumes (MOD) LV vol d, MOD A2C: 116.0 ml LV vol d, MOD A4C: 132.0 ml LV vol s, MOD A2C: 10.8 ml LV vol s, MOD A4C: 27.5 ml LV SV MOD A2C:     105.2 ml LV SV MOD A4C:     132.0 ml LV SV MOD BP:      106.0 ml RIGHT VENTRICLE RV Basal diam:  3.10 cm RV S prime:     13.30 cm/s TAPSE (M-mode): 1.6 cm LEFT ATRIUM             Index        RIGHT ATRIUM           Index LA diam:        3.40 cm 1.89 cm/m   RA Area:     14.00 cm LA Vol (A2C):   57.0 ml 31.71 ml/m  RA Volume:   35.00 ml  19.47 ml/m LA Vol (A4C):   52.8 ml 29.38 ml/m LA Biplane Vol: 58.7 ml 32.66 ml/m  AORTIC VALVE  PULMONIC VALVE AV Area (Vmax):    2.13 cm      PV Vmax:          1.27 m/s AV Area (Vmean):   1.97 cm      PV Peak grad:     6.4 mmHg AV Area (VTI):     2.11 cm      PR End Diast Vel: 4.84 msec AV Vmax:           178.00 cm/s AV Vmean:          128.000 cm/s AV VTI:            0.375 m AV Peak Grad:      12.7 mmHg AV Mean Grad:      7.0 mmHg LVOT Vmax:         134.00 cm/s LVOT Vmean:        88.800 cm/s LVOT VTI:          0.279 m LVOT/AV VTI ratio: 0.74  AORTA Ao Root diam: 3.00 cm MITRAL VALVE               TRICUSPID VALVE MV Area (PHT): 3.68 cm    TR Peak grad:   24.2 mmHg MV Decel Time: 206 msec    TR Vmax:        246.00 cm/s MV E velocity: 84.40 cm/s MV  A velocity: 54.50 cm/s  SHUNTS MV E/A ratio:  1.55        Systemic VTI:  0.28 m                            Systemic Diam: 1.90 cm Steven Rogue MD Electronically signed by Steven Rogue MD Signature Date/Time: 04/21/2022/1:00:06 PM    Final    CT Soft Tissue Neck W Contrast  Result Date: 04/20/2022 CLINICAL DATA:  Provided history: Epiglottitis or tonsillitis suspected. Sore throat for 2 weeks. Strep diagnosis. Syncopal episode today. EXAM: CT NECK WITH CONTRAST TECHNIQUE: Multidetector CT imaging of the neck was performed using the standard protocol following the bolus administration of intravenous contrast. RADIATION DOSE REDUCTION: This exam was performed according to the departmental dose-optimization program which includes automated exposure control, adjustment of the mA and/or kV according to patient size and/or use of iterative reconstruction technique. CONTRAST:  33mL OMNIPAQUE IOHEXOL 300 MG/ML  SOLN COMPARISON:  No pertinent prior exams available for comparison. FINDINGS: Pharynx and larynx: Prominence of the palatine tonsils (left greater than right), as well as adenoid tonsils. 3.7 x 3.3 x 5.7 cm hypoattenuating/hypoenhancing lesion in the region of the left palatine tonsil compatible with a tonsillar/peritonsillar abscess (for instance as seen on series 2, image 34) (series 5, image 44). Mucosal/submucosal edema within the left oropharynx and left greater than right supraglottic larynx. Retropharyngeal edema without peripheral enhancement to suggest retropharyngeal abscess at this time. Moderate effacement of the oropharyngeal and supraglottic laryngeal airway. Salivary glands: No inflammation, mass, or stone. Thyroid: Unremarkable. Lymph nodes: Bilateral cervical lymphadenopathy. An index left level 2 lymph node measures 18 mm in short axis (series 2, image 44). Vascular: The major vascular structures of the neck are patent. Limited intracranial: No evidence of an acute intracranial abnormality  within the field of view. Visualized orbits: Incompletely imaged. No orbital mass or acute orbital finding at the imaged levels. Mastoids and visualized paranasal sinuses: The frontal sinuses, and portions of the ethmoid air cells, are excluded from the field of view superiorly. No significant paranasal  sinus disease or mastoid effusion at the imaged levels. Skeleton: Reversal of the expected cervical lordosis. No acute fracture or aggressive osseous lesion. Upper chest: No consolidation within the imaged lung apices. These results were called by telephone at the time of interpretation on 04/20/2022 at 7:00 pm to provider Merlyn Lot , who verbally acknowledged these results. IMPRESSION: 1. Findings compatible with acute pharyngitis, tonsillitis and supraglottitis. 2. 3.7 x 3.3 x 5.7 cm left tonsillar/peritonsillar abscess. 3. Retropharyngeal edema without peripheral enhancement to suggest retropharyngeal abscess at this time. 4. Moderate effacement of the oropharyngeal and supraglottic laryngeal airway. 5. Bilateral cervical lymphadenopathy, likely reactive. Electronically Signed   By: Kellie Simmering D.O.   On: 04/20/2022 19:07    Microbiology: Results for orders placed or performed during the [name] encounter of 04/20/22  Aerobic/Anaerobic Culture w Gram Stain (surgical/deep wound)     Status: None (Preliminary result)   Collection Time: 04/20/22  8:22 PM   Specimen: Throat  Result Value Ref Range Status   Specimen Description   Final    THROAT Performed at [facility], [address] [place], [place] [address]    Special Requests   Final    NONE Performed at [facility], [address] [place], [place] [address]    Gram Stain   Final    RARE WBC PRESENT,BOTH PMN AND MONONUCLEAR FEW GRAM POSITIVE COCCI IN CLUSTERS    Culture   Final    MODERATE STREPTOCOCCUS GROUP C Beta hemolytic streptococci are predictably susceptible to penicillin and other beta lactams.  Susceptibility testing not routinely performed. HOLDING FOR POSSIBLE ANAEROBE Performed at [name], [place] [address], [place], [place] [address]    Report Status PENDING  Incomplete  Culture, blood (Routine X 2) w Reflex to ID Panel     Status: None (Preliminary result)   Collection Time: 04/20/22 10:16 PM   Specimen: BLOOD  Result Value Ref Range Status   Specimen Description   Final    BLOOD BLOOD RIGHT HAND Performed at [facility], [address], [place], [place] [address]    Special Requests   Final    BOTTLES DRAWN AEROBIC AND ANAEROBIC Blood Culture results may not be optimal due to an excessive volume of blood received in culture bottles Performed at [facility], [address], [place], [place] [address]    Culture   Final    NO GROWTH 4 DAYS Performed at [name], [place] [address] [place], [place] [address]    Report Status PENDING  Incomplete  Culture, blood (Routine X 2) w Reflex to ID Panel     Status: None (Preliminary result)   Collection Time: 04/20/22 10:23 PM   Specimen: BLOOD  Result Value Ref Range Status   Specimen Description   Final    BLOOD BLOOD LEFT HAND Performed at [name], [address] [place], [place] [address]    Special Requests   Final    BOTTLES DRAWN AEROBIC AND ANAEROBIC Blood Culture results may not be optimal due to an excessive volume of blood received in culture bottles Performed at [facility], [address], [place], [place] [address]    Culture   Final    NO GROWTH 4 DAYS Performed at [name], [place] [address], [place], [place] [address]    Report Status PENDING  Incomplete    Labs: CBC: Recent Labs  Lab 04/20/22 1654 04/21/22 0018 04/22/22 0451  WBC 24.6* 23.4* 16.0*  NEUTROABS 20.4*  --   --   HGB 12.8* 12.3* 12.0*  HCT 40.1 38.2* 37.0*  MCV 86.8 84.9 84.9  PLT 453* 459* [id number]*   Basic Metabolic Panel: Recent Labs  Lab 04/20/22 1654  04/21/22 0018 04/22/22 0451  NA 132* 132* 137  K 4.5 4.2 4.2  CL 102 104 106  CO2 21* 21* 25  GLUCOSE 104* 137* 135*  BUN 16 15 18   CREATININE 1.18 0.91 0.81  CALCIUM 8.4* 8.4* 8.7*   Liver Function Tests: Recent Labs  Lab 04/20/22 1654  AST 24  ALT 10  ALKPHOS 53  BILITOT 1.8*  PROT 8.3*  ALBUMIN 3.5   CBG: No results for input(s): "GLUCAP" in the last 168 hours.  Discharge time spent: greater than 30 minutes.  Signed: Max Sane, MD [facility] 04/24/2022

## 2022-04-26 LAB — CULTURE, BLOOD (ROUTINE X 2)
Culture: NO GROWTH
Culture: NO GROWTH

## 2023-01-15 DIAGNOSIS — Z131 Encounter for screening for diabetes mellitus: Secondary | ICD-10-CM | POA: Diagnosis not present

## 2023-01-15 DIAGNOSIS — Z13220 Encounter for screening for lipoid disorders: Secondary | ICD-10-CM | POA: Diagnosis not present

## 2023-01-15 DIAGNOSIS — Z113 Encounter for screening for infections with a predominantly sexual mode of transmission: Secondary | ICD-10-CM | POA: Diagnosis not present

## 2023-01-15 DIAGNOSIS — Z1159 Encounter for screening for other viral diseases: Secondary | ICD-10-CM | POA: Diagnosis not present

## 2023-01-24 DIAGNOSIS — I422 Other hypertrophic cardiomyopathy: Secondary | ICD-10-CM | POA: Diagnosis not present

## 2023-03-06 DIAGNOSIS — I422 Other hypertrophic cardiomyopathy: Secondary | ICD-10-CM | POA: Diagnosis not present

## 2023-03-06 DIAGNOSIS — R030 Elevated blood-pressure reading, without diagnosis of hypertension: Secondary | ICD-10-CM | POA: Diagnosis not present

## 2023-03-28 ENCOUNTER — Encounter: Payer: Self-pay | Admitting: *Deleted

## 2023-04-03 ENCOUNTER — Ambulatory Visit (INDEPENDENT_AMBULATORY_CARE_PROVIDER_SITE_OTHER): Payer: 59

## 2023-04-03 ENCOUNTER — Ambulatory Visit: Payer: 59 | Attending: Cardiology | Admitting: Cardiology

## 2023-04-03 VITALS — BP 100/70 | HR 46 | Ht 69.0 in | Wt 152.0 lb

## 2023-04-03 DIAGNOSIS — I422 Other hypertrophic cardiomyopathy: Secondary | ICD-10-CM

## 2023-04-03 DIAGNOSIS — I517 Cardiomegaly: Secondary | ICD-10-CM | POA: Diagnosis not present

## 2023-04-03 NOTE — Patient Instructions (Addendum)
 Medication Instructions:  - Your physician recommends that you continue on your current medications as directed. Please refer to the Current Medication list given to you today.  *If you need a refill on your cardiac medications before your next appointment, please call your pharmacy*   Lab Work: - none ordered  If you have labs (blood work) drawn today and your tests are completely normal, you will receive your results only by: MyChart Message (if you have MyChart) OR A paper copy in the mail If you have any lab test that is abnormal or we need to change your treatment, we will call you to review the results.   Testing/Procedures:  1) You have been referred to : the Hypertrophic Cardiomyopathy Clinic in our [facility].  You should be called with an appointment, but is you do not hear from the scheduling team after 1 week, please call our office at [phone] or send [place] a MyChart message so we may follow up on this for you.    2) Cardiac MRI: - Your physician has requested that you have a cardiac MRI. Cardiac MRI uses a computer to create images of your heart as its beating, producing both still and moving pictures of your heart and major blood vessels.    You are scheduled for Cardiac MRI at the location below.  Please arrive for your appointment at ______________ . ?  [facility] [address] [place], [place] [address] Please take advantage of the free valet parking available at the [facility] and [employer] (Entrance C).  Proceed to the [facility] Radiology Department (First Floor) for check-in.   OR   [facility] [address] [place], [place] [address] Please go to the [facility] and check-in with the desk attendant.   Magnetic resonance imaging (MRI) is a painless test that produces images of the inside of the body without using Xrays.  During an MRI, strong magnets and radio waves work together in a Designer, industrial/product to form detailed images.   MRI images may provide more details about a medical condition than X-rays, CT scans, and ultrasounds can provide.  You may be given earphones to listen for instructions.  You may eat a light breakfast and take medications as ordered with the exception of furosemide, hydrochlorothiazide, chlorthalidone or spironolactone (or any other fluid pill). If you are undergoing a stress MRI, please avoid stimulants for 12 hr prior to test. (I.e. Caffeine, nicotine, chocolate, or antihistamine medications)  An IV will be inserted into one of your veins. Contrast material will be injected into your IV. It will leave your body through your urine within a day. You may be told to drink plenty of fluids to help flush the contrast material out of your system.  You will be asked to remove all metal, including: Watch, jewelry, and other metal objects including hearing aids, hair pieces and dentures. Also wearable glucose monitoring systems (ie. Freestyle Libre and Omnipods) (Braces and fillings normally are not a problem.)   TEST WILL TAKE APPROXIMATELY 1 HOUR  PLEASE NOTIFY SCHEDULING AT LEAST 24 HOURS IN ADVANCE IF YOU ARE UNABLE TO KEEP YOUR APPOINTMENT. [phone]  For more information and frequently asked questions, please visit our website : [email or web address]  Please call the Cardiac Imaging Nurse Navigators with any questions/concerns. [phone] Office    3) Heart Monitor:  Your physician has requested you wear a ZIO XT (heart) monitor for 14  days.  Your monitor will  be mailed to your home address within 3-5 business days. This is sent via Fed Ex from [employer]. However, if you have not received your monitor after 5 business days please send [place] a MyChart message or call the office at [phone], so we may follow up on this for you.   This monitor is a medical device (single patch monitor) that records the heart's electrical  activity. Doctors most often use these monitors to diagnose arrhythmias. Arrhythmias are problems with the speed or rhythm of the heartbeat.   i[employer]hythm supplies 1 patch per enrollment. Additional stickers are not available.  Please DO NOT apply the patch if you will be having a Nuclear Stress Test, Echocardiogram, Cardiac CT, Cardiac MRI, Chest X-ray during the period you would be wearing the monitor. The patch cannot be worn during these tests.  You cannot remove and re-apply the ZIO patch monitor.   Applying the Monitor: Once you receive your monitor, this will include a small razor, abrader, and 4 alcohol pads. Shave hair from upper left chest Rub abrader disc in 40 strokes over the left upper chest as indicated in your monitor instructions Clean area with 4 enclosed alcohol pads (there may be a mild & brief stinging sensation over the newly abraded area, but this is normal). Let dry Apply patch as indicated in monitor instructions. Patch will be placed under collarbone on the left side of the chest with arrow pointing upward. Rub adhesive wings for 2 minutes. Remove white label marked "1". Remove the white label marked "2". Rub patch adhesive wings for an 2 minutes.  While looking in a mirror, press and release button in the center of the patch. You may hear a "click". A small green light will flash 4-6 times and then stop. This will be your indicator that the monitor has been turned on.  Wearing the Monitor: Avoid showering during the first 24 hours of wearing the monitor.  After 24 hours you may shower with the patch on. Take brief showers with your back facing the shower head.  Avoid excessive sweating to help maximize wear time. Do not submerge the device, no hot tubs, and no swimming pools. Keep any lotions or oils away from the patch. Press the button if you feel a symptom. You will hear a small click. Record date, time, and symptoms in the Patient Logbook or App.  Monitor  Issues: Call [employer] Customer Care at [phone] if you have questions regarding your Zio Patch Monitor. Call them immediately if you see an orange/ amber colored light blinking on your monitor. If your monitor falls off and you cannot get this reapplied or if you need suggestions for securing your monitor call [employer] at [phone].   Returning the Monitor: Once you have completed wearing your monitor, follow instructions on the last 2 pages of the Patient Logbook. Stick monitor patch on to the last page of the Patient Logbook.  Place Patient Logbook with monitor in the return box provided. Use locking tab on box and tape box closed securely. The return box has pre-paid postage on it.  Place the return box in the regular [place] Mail box as soon as possible It will take anywhere from 1-2 weeks for your provider to receive and review your results once you mail this back. If for some reason you have misplaced your return box then call our office and we can provide another box and/or mail it off for you.   Billing  and Patient  Assistance Program Information: We have supplied [employer] with any of your insurance information on file for billing purposes. [employer] offers a sliding scale Patient Assistance Program for patients that do not have insurance, or whose insurance does not completely cover the cost of the ZIO monitor. You must apply for the Patient Assistance Program to qualify for this discounted rate. To apply, please call [employer] at [phone], select option 1, ask to apply for the Patient Assistance Program. [employer] will ask your household income, and how many people are in your household. They will quote your out-of-pocket cost based on that information. [employer] will also be able to set up for a 43-month, interest-free payment plan if needed.      Follow-Up: At [facility], you and your health needs are our priority.  As part of our continuing mission to  provide you with exceptional heart care, we have created designated Provider Care Teams.  These Care Teams include your primary Cardiologist (physician) and Advanced Practice Providers (APPs -  Physician Assistants and Nurse Practitioners) who all work together to provide you with the care you need, when you need it.  We recommend signing up for the patient portal called "MyChart".  Sign up information is provided on this After Visit Summary.  MyChart is used to connect with patients for Virtual Visits (Telemedicine).  Patients are able to view lab/test results, encounter notes, upcoming appointments, etc.  Non-urgent messages can be sent to your provider as well.   To learn more about what you can do with MyChart, go to [email or web address].    Your next appointment:   3 month(s)  Provider:   Debbe Odea, MD    Other Instructions N/a

## 2023-04-03 NOTE — Progress Notes (Signed)
 Cardiology Office Note:    Date:  04/03/2023   ID:  Steven [name], Steven [name] 2000-12-10, MRN [id number]  PCP:  Saverio Danker, MD   [facility] Providers Cardiologist:  None     Referring MD: Jodi Marble, NP   No chief complaint on file.  Steven [name] is a 23 y.o. male who is being seen today for the evaluation of LVH at the request of Jodi Marble, NP.   History of Present Illness:    Steven [name] is a 23 y.o. male with a hx of apical HCM who presents to establish care.  Previously seen at [facility] for LVH and apical HCM.  Was seeing pediatric cardiologist, due to becoming an adult, he needed adult care.  Was admitted at [facility] 04/2022 with symptoms of syncope in the setting of tonsillar abscess, cough and vomiting.  Etiology deemed possible vasovagal.  Denies any palpitations, dizziness, presyncope or syncope.  Denies chest pain or shortness of breath.  Presents with on today, not sure if parents have history of HCM.  He has a half sister.  Echocardiogram 04/2022 EF 60 to 65%, severe LVH and mid to apical LV segments.  Past Medical History:  Diagnosis Date   Headache    migraines, rare   Left ventricular hypertrophy    note - Dr Elizebeth Brooking - [facility] 12/25/15   Premature birth    was not due until Jan.  Stayed in hosp until Jan.    Past Surgical History:  Procedure Laterality Date   ADENOIDECTOMY Bilateral 01/30/2016   Procedure: ADENOIDECTOMY;  Surgeon: Geanie Logan, MD;  Location: [facility];  Service: ENT;  Laterality: Bilateral;   MYRINGOTOMY WITH TUBE PLACEMENT Bilateral 01/30/2016   Procedure: MYRINGOTOMY WITH TUBE PLACEMENT;  Surgeon: Geanie Logan, MD;  Location: [facility];  Service: ENT;  Laterality: Bilateral;   NO PAST SURGERIES      Current Medications: No outpatient medications have been marked as taking for the 04/03/23 encounter (Office Visit) with Debbe Odea, MD.     Allergies:   Patient has no  known allergies.   Social History   Socioeconomic History   Marital status: Single    Spouse name: Not on file   Number of children: Not on file   Years of education: Not on file   Highest education level: Not on file  Occupational History   Not on file  Tobacco Use   Smoking status: Never   Smokeless tobacco: Never  Substance and Sexual Activity   Alcohol use: Not on file   Drug use: Yes    Types: Marijuana   Sexual activity: Not on file  Other Topics Concern   Not on file  Social History Narrative   Not on file   Social Drivers of Health   Financial Resource Strain: Not on file  Food Insecurity: No Food Insecurity (04/20/2022)   Hunger Vital Sign    Worried About Running Out of Food in the Last Year: Never true    Ran Out of Food in the Last Year: Never true  Transportation Needs: No Transportation Needs (04/20/2022)   PRAPARE - Administrator, Civil Service (Medical): No    Lack of Transportation (Non-Medical): No  Physical Activity: Not on file  Stress: Not on file  Social Connections: Not on file     Family History: The patient's family history is not on file.  ROS:   Please see the history of present illness.  All other systems reviewed and are negative.  EKGs/Labs/Other Studies Reviewed:    The following studies were reviewed today:  EKG Interpretation Date/Time:  Thursday April 03 2023 08:34:55 EST Ventricular Rate:  46 PR Interval:  160 QRS Duration:  86 QT Interval:  500 QTC Calculation: 437 R Axis:   61  Text Interpretation: Sinus bradycardia Marked ST abnormality, possible lateral subendocardial injury deep twave inversions on precordial leads. consider apical hcm Confirmed by Debbe Odea ([id number]) on 04/03/2023 8:52:50 AM    Recent Labs: 04/20/2022: ALT 10 04/22/2022: BUN 18; Creatinine, Ser 0.81; Hemoglobin 12.0; Platelets 505; Potassium 4.2; Sodium 137  Recent Lipid Panel No results found for: "CHOL", "TRIG", "HDL",  "CHOLHDL", "VLDL", "LDLCALC", "LDLDIRECT"   Risk Assessment/Calculations:             Physical Exam:    VS:  BP 100/70 (BP Location: Left Arm, Patient Position: Sitting, Cuff Size: Normal)   Pulse (!) 46   Ht 5\' 9"  (1.753 m)   Wt 152 lb (68.9 kg)   SpO2 97%   BMI 22.45 kg/m     Wt Readings from Last 3 Encounters:  04/03/23 152 lb (68.9 kg)  04/20/22 144 lb 1.6 oz (65.4 kg) (33%, Z= -0.43)*  03/23/19 168 lb (76.2 kg) (86%, Z= 1.08)*   * Growth percentiles are based on CDC (Boys, 2-20 Years) data.     GEN:  Well nourished, well developed in no acute distress HEENT: Normal NECK: No JVD; No carotid bruits CARDIAC: RRR, no murmurs, rubs, gallops RESPIRATORY:  Clear to auscultation without rales, wheezing or rhonchi  ABDOMEN: Soft, non-tender, non-distended MUSCULOSKELETAL:  No edema; No deformity  SKIN: Warm and dry NEUROLOGIC:  Alert and oriented x 3 PSYCHIATRIC:  Normal affect   ASSESSMENT:    1. Hypertrophic cardiomyopathy (HCC)   2. LVH (left ventricular hypertrophy)    PLAN:    In order of problems listed above:  History of apical HCM, echo 04/2022 EF 60 to 65%, apical HCM.  LVH noted on EKG, deep giant T wave inversions in precordial leads consistent with apical HCM.  Obtain CMR.  Place cardiac monitor.  Refer to HCM clinic.  Baseline heart rate 42 off AV nodal agents.  Advised on screening immediate family members.  Follow-up after cardiac testing      Medication Adjustments/Labs and Tests Ordered: Current medicines are reviewed at length with the patient today.  Concerns regarding medicines are outlined above.  Orders Placed This Encounter  Procedures   MR CARDIAC MORPHOLOGY W WO CONTRAST   Ambulatory referral to Cardiology   LONG TERM MONITOR (3-14 DAYS)   EKG 12-Lead   No orders of the defined types were placed in this encounter.   Patient Instructions  Medication Instructions:  - Your physician recommends that you continue on your current  medications as directed. Please refer to the Current Medication list given to you today.  *If you need a refill on your cardiac medications before your next appointment, please call your pharmacy*   Lab Work: - none ordered  If you have labs (blood work) drawn today and your tests are completely normal, you will receive your results only by: MyChart Message (if you have MyChart) OR A paper copy in the mail If you have any lab test that is abnormal or we need to change your treatment, we will call you to review the results.   Testing/Procedures:  1) You have been referred to : the Hypertrophic Cardiomyopathy Clinic in our [place]  Office.  You should be called with an appointment, but is you do not hear from the scheduling team after 1 week, please call our office at [phone] or send [place] a MyChart message so we may follow up on this for you.    2) Cardiac MRI: - Your physician has requested that you have a cardiac MRI. Cardiac MRI uses a computer to create images of your heart as its beating, producing both still and moving pictures of your heart and major blood vessels.    You are scheduled for Cardiac MRI at the location below.  Please arrive for your appointment at ______________ . ?  [facility] [address] [place], [place] [address] Please take advantage of the free valet parking available at the [facility] and [employer] (Entrance C).  Proceed to the [facility] Radiology Department (First Floor) for check-in.   OR   [facility] [address] [place], [place] [address] Please go to the [facility] and check-in with the desk attendant.   Magnetic resonance imaging (MRI) is a painless test that produces images of the inside of the body without using Xrays.  During an MRI, strong magnets and radio waves work together in a Data processing manager to form detailed images.   MRI images may provide more details about a medical  condition than X-rays, CT scans, and ultrasounds can provide.  You may be given earphones to listen for instructions.  You may eat a light breakfast and take medications as ordered with the exception of furosemide, hydrochlorothiazide, chlorthalidone or spironolactone (or any other fluid pill). If you are undergoing a stress MRI, please avoid stimulants for 12 hr prior to test. (I.e. Caffeine, nicotine, chocolate, or antihistamine medications)  An IV will be inserted into one of your veins. Contrast material will be injected into your IV. It will leave your body through your urine within a day. You may be told to drink plenty of fluids to help flush the contrast material out of your system.  You will be asked to remove all metal, including: Watch, jewelry, and other metal objects including hearing aids, hair pieces and dentures. Also wearable glucose monitoring systems (ie. Freestyle Libre and Omnipods) (Braces and fillings normally are not a problem.)   TEST WILL TAKE APPROXIMATELY 1 HOUR  PLEASE NOTIFY SCHEDULING AT LEAST 24 HOURS IN ADVANCE IF YOU ARE UNABLE TO KEEP YOUR APPOINTMENT. [phone]  For more information and frequently asked questions, please visit our website : [email or web address]  Please call the Cardiac Imaging Nurse Navigators with any questions/concerns. [phone] Office    3) Heart Monitor:  Your physician has requested you wear a ZIO XT (heart) monitor for 14  days.  Your monitor will be mailed to your home address within 3-5 business days. This is sent via [employer] from [employer]. However, if you have not received your monitor after 5 business days please send [place] a MyChart message or call the office at [phone], so we may follow up on this for you.   This monitor is a medical device (single patch monitor) that records the heart's electrical activity. Doctors most often use these monitors to diagnose arrhythmias. Arrhythmias are  problems with the speed or rhythm of the heartbeat.   [employer] supplies 1 patch per enrollment. Additional stickers are not available.  Please DO NOT apply the patch if you will be having a Nuclear Stress Test, Echocardiogram, Cardiac CT, Cardiac MRI, Chest X-ray during the  period you would be wearing the monitor. The patch cannot be worn during these tests.  You cannot remove and re-apply the ZIO patch monitor.   Applying the Monitor: Once you receive your monitor, this will include a small razor, abrader, and 4 alcohol pads. Shave hair from upper left chest Rub abrader disc in 40 strokes over the left upper chest as indicated in your monitor instructions Clean area with 4 enclosed alcohol pads (there may be a mild & brief stinging sensation over the newly abraded area, but this is normal). Let dry Apply patch as indicated in monitor instructions. Patch will be placed under collarbone on the left side of the chest with arrow pointing upward. Rub adhesive wings for 2 minutes. Remove white label marked "1". Remove the white label marked "2". Rub patch adhesive wings for an 2 minutes.  While looking in a mirror, press and release button in the center of the patch. You may hear a "click". A small green light will flash 4-6 times and then stop. This will be your indicator that the monitor has been turned on.  Wearing the Monitor: Avoid showering during the first 24 hours of wearing the monitor.  After 24 hours you may shower with the patch on. Take brief showers with your back facing the shower head.  Avoid excessive sweating to help maximize wear time. Do not submerge the device, no hot tubs, and no swimming pools. Keep any lotions or oils away from the patch. Press the button if you feel a symptom. You will hear a small click. Record date, time, and symptoms in the Patient Logbook or App.  Monitor Issues: Call [employer] Customer Care at [phone] if you have questions regarding  your Zio Patch Monitor. Call them immediately if you see an orange/ amber colored light blinking on your monitor. If your monitor falls off and you cannot get this reapplied or if you need suggestions for securing your monitor call [employer] at [phone].   Returning the Monitor: Once you have completed wearing your monitor, follow instructions on the last 2 pages of the Patient Logbook. Stick monitor patch on to the last page of the Patient Logbook.  Place Patient Logbook with monitor in the return box provided. Use locking tab on box and tape box closed securely. The return box has pre-paid postage on it.  Place the return box in the regular [place] Mail box as soon as possible It will take anywhere from 1-2 weeks for your provider to receive and review your results once you mail this back. If for some reason you have misplaced your return box then call our office and we can provide another box and/or mail it off for you.   Billing  and Patient Assistance Program Information: We have supplied [employer] with any of your insurance information on file for billing purposes. [employer] offers a sliding scale Patient Assistance Program for patients that do not have insurance, or whose insurance does not completely cover the cost of the ZIO monitor. You must apply for the Patient Assistance Program to qualify for this discounted rate. To apply, please call [employer] at [phone], select option 1, ask to apply for the Patient Assistance Program. [employer] will ask your household income, and how many people are in your household. They will quote your out-of-pocket cost based on that information. [employer] will also be able to set up for a 62-month, interest-free payment plan if needed.      Follow-Up: At [facility], you  and your health needs are our priority.  As part of our continuing mission to provide you with exceptional heart care, we have created designated Provider Care Teams.  These Care  Teams include your primary Cardiologist (physician) and Advanced Practice Providers (APPs -  Physician Assistants and Nurse Practitioners) who all work together to provide you with the care you need, when you need it.  We recommend signing up for the patient portal called "MyChart".  Sign up information is provided on this After Visit Summary.  MyChart is used to connect with patients for Virtual Visits (Telemedicine).  Patients are able to view lab/test results, encounter notes, upcoming appointments, etc.  Non-urgent messages can be sent to your provider as well.   To learn more about what you can do with MyChart, go to [email or web address].    Your next appointment:   3 month(s)  Provider:   Debbe Odea, MD    Other Instructions N/a       Signed, Debbe Odea, MD  04/03/2023 9:41 AM    [facility]

## 2023-05-01 DIAGNOSIS — I422 Other hypertrophic cardiomyopathy: Secondary | ICD-10-CM

## 2023-05-02 ENCOUNTER — Encounter: Payer: Self-pay | Admitting: Emergency Medicine

## 2023-06-24 ENCOUNTER — Encounter (HOSPITAL_COMMUNITY): Payer: Self-pay

## 2023-06-25 ENCOUNTER — Ambulatory Visit
Admission: RE | Admit: 2023-06-25 | Discharge: 2023-06-25 | Disposition: A | Discharge status: Home / Self Care | Payer: Self-pay | Source: Ambulatory Visit | Attending: Cardiology | Admitting: Cardiology

## 2023-06-25 ENCOUNTER — Other Ambulatory Visit: Payer: Self-pay | Admitting: Cardiology

## 2023-06-25 ENCOUNTER — Ambulatory Visit: Payer: Self-pay | Admitting: Cardiology

## 2023-06-25 DIAGNOSIS — I517 Cardiomegaly: Secondary | ICD-10-CM | POA: Insufficient documentation

## 2023-06-25 DIAGNOSIS — I422 Other hypertrophic cardiomyopathy: Secondary | ICD-10-CM

## 2023-06-25 MED ORDER — GADOBUTROL 1 MMOL/ML IV SOLN
9.0000 mL | Freq: Once | INTRAVENOUS | Status: AC | PRN
  Administered 2023-06-25: 9 mL via INTRAVENOUS

## 2023-06-26 ENCOUNTER — Other Ambulatory Visit: Payer: Self-pay | Admitting: Cardiology

## 2023-06-26 DIAGNOSIS — I422 Other hypertrophic cardiomyopathy: Secondary | ICD-10-CM

## 2023-07-01 ENCOUNTER — Ambulatory Visit: Payer: 59 | Admitting: Cardiology

## 2023-07-02 ENCOUNTER — Encounter: Payer: Self-pay | Admitting: Cardiology

## 2023-07-02 ENCOUNTER — Ambulatory Visit: Payer: Self-pay | Attending: Cardiology | Admitting: Cardiology

## 2023-07-02 VITALS — BP 110/60 | HR 60 | Ht 69.0 in | Wt 157.4 lb

## 2023-07-02 DIAGNOSIS — I422 Other hypertrophic cardiomyopathy: Secondary | ICD-10-CM

## 2023-07-02 DIAGNOSIS — Z8679 Personal history of other diseases of the circulatory system: Secondary | ICD-10-CM

## 2023-07-02 DIAGNOSIS — I517 Cardiomegaly: Secondary | ICD-10-CM

## 2023-07-02 NOTE — Progress Notes (Signed)
 Cardiology Office Note:  .   Date:  07/02/2023  ID:  Steven [name], DOB December 12, 2000, MRN [id number] PCP: Lyna Sandhoff, MD  [facility] Providers Cardiologist:  None    History of Present Illness: .   Steven [name] is a 23 y.o. male with a past medical history of hypertrophic cardiomyopathy followed by [facility], history of syncope, who is here today for follow-up.   He had previously been followed by [facility] for LVH.  Echocardiogram in 2021 showed normal LVSF, mild MR, mild asymmetric LVH, trivial systolic anterior displacement of the chordal apparatus of the mitral valve, noticing minimal late systolic dynamic left ventricular outflow tract obstruction.  Heart monitor in 06/2019 showed normal sinus rhythm with an average heart rate of 69 bpm, 479 isolated ventricular episodes, 11 isolated ventricular ectopics, 104 triggered events.  Echocardiogram 07/2020 showed normal LVSF, mild TR, increased left ventricular hypertrophy seen in the LV apex with minimal flow acceleration, trivial systolic anterior displacement of coronary apparatus of the mitral valve, causing dynamic left ventricular outflow tract obstruction.  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  He presented to the [facility] emergency department 04/20/2022 with significant pain and a sore throat.  Sore throat x 2 weeks was treated with amoxicillin .  Strep was negative at that time.  Sore throat initially went away but then returned and it was sore on both sides of his throat and had a syncopal event.  Patient was seated and started feeling unwell and vomited he felt lightheaded.  He slumped over and lost  consciousness.  Initial vitals in the emergency department revealed a blood pressure 129/71, pulse 106, respiration of 20, labs revealed WBC of 24.6, hemoglobin of 12.8, sodium 132, serum creatinine 1.18, BUN 16, CO2 21, CT of the throat showed a left peritonsillar abscess and he underwent I&D.  EKG showed sinus tach with heart rate 102 bpm, LVH with repolarization abnormality, the patient was admitted for further workup to the medical service.  Echocardiogram was completed that revealed LVEF 60 to 65%, no RWMA, moderate to severe concentric left ventricular hypertrophy in the mid ventricle to apical region, left ventricular diastolic parameters were indeterminate, LVOT gradient of 20 mmHg, right ventricular systolic function was normal biventricular size was normal.  The estimated right ventricular systolic pressure is 27.2 mmHg.  Mild to moderate mitral valve regurgitation.  He continued workup in the hospital and was discharged with recommended follow-up.   Patient was last seen in clinic 04/03/2023 by Dr. Junnie Olives for the evaluation of his LVH at the request of his primary care provider.  He was no longer following with [facility] since he was previously followed by pediatric cardiologist (for, not told he needed adult care.  He denied any palpitations, dizziness, presyncope or syncope.  Denied any chest pain or shortness of breath.  Had not had any further episodes of syncope.  Was not sure if there was a family history.  EKG showed sinus bradycardia with marked ST abnormality possible lateral subendocardial injury with deep T wave inversions on her precordial leads.  She was scheduled for cardiac MRI and placed on a cardiac monitor.  He was also to be referred to the hypertrophic cardiomyopathy clinic.  The  monitor revealed an average heart rate of 72 bpm there was no atrial fibrillation or flutter no significant sustained arrhythmias.  He returns to clinic today stating that overall he has been doing well.  He  denies any chest pain, shortness of breath, peripheral edema, lightheadedness/dizziness, near-syncope or syncope.  Recently undergone cardiac MRI.  Previously was seen by Dr Junnie Olives patient states he was can be referred to hypertrophic cardiomyopathy clinic but he is yet to hear about any of that.  He is currently not on any medications.  Previously wore a heart monitor due to baseline bradycardia at last visit.  Denies any hospitalizations or visits to the emergency department.  States he has not followed up with his PCP this year.  ROS: 10 point review of system has been reviewed and considered negative except ones been listed in HPI  Studies Reviewed: .       cMRI 06/25/2023 IMPRESSION: 1.  Normal LV size and systolic function.  LVEF 68%.   2.  Severe apical LV wall thickness, measuring upto 1.5 cm.   3. 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   4.  Normal RV size and systolic function.   5.  Findings consistent with apical Hypertrophic cardiomyopathy.  Event Monitor (Zio) 05/01/2023 Average heart rate 72, range 37-178. No atrial fibrillation or atrial flutter. No significant or sustained arrhythmias.  2D echo 04/21/2022 1. Left ventricular ejection fraction, by estimation, is 60 to 65%. The  left ventricle has normal function. The left ventricle has no regional  wall motion abnormalities. There is moderate to severe concentric left  ventricular hypertrophy of the mid  ventrical to apical region. Left ventricular diastolic parameters are  indeterminate. LVOT gradient 20 mm Hg   2. Right ventricular systolic function is normal. The right ventricular  size is normal. There is normal pulmonary artery systolic pressure. The  estimated right ventricular systolic pressure is 27.2 mmHg.   3. The mitral valve is normal in structure. Mild to moderate mitral valve  regurgitation. No evidence of mitral stenosis.   4. Tricuspid valve  regurgitation is moderate.   5. The aortic valve is tricuspid. Aortic valve regurgitation is not  visualized. No aortic stenosis is present.   6. The inferior vena cava is normal in size with greater than 50%  respiratory variability, suggesting right atrial pressure of 3 mmHg.    cMRI 09/2020 Exam:  MRI Cardiac Morph W Wo Contrast  Impressions:  Mid ventricular and apical hypertrophic cardiomyopathy with wall thickness  up to 2.5 cm  Hyperdynamic  LV Systolic function EF = 72% with systolic near  obliteration of the apical and mid ventricle  Patchy late gadolinium enhancement suggestive of myocardial fibrosis  involving the apical septal, distal mid septal, mid anterior and apical  lateral segments  No significant valvular abnormality  Normal right ventricular size and systolic function    Echo 07/2020 Summary:   1. Mild tricuspid valve regurgitation.   2. Right ventricular systolic pressure estimate = 27.2 mmHg.   3. Mildly hypertrophied left ventricle.   4. Increased left ventricular hypertrophy seen in LV apex with minimal flow acceleration.   5. Normal left ventricular systolic function.   6. Trivial systolic anterior displacement of chordal apparatus of the mitral valve, causing dynamic left ventricular outflow tract obstruction.   7. Normal right ventricular cavity size and systolic function.   8. Normal aorta.   9. No pericardial effusion.    Echo 04/2019  Summary:   1. Mild tricuspid valve regurgitation.   2. Mildly hypertrophied left ventricle.   3. Mild asymmetry of LVH with IVS/PW ratio 1.2.   4. Normal left ventricular systolic function.   5. Trivial systolic anterior displacement of chordal apparatus of the mitral valve, causing minimal late systolic dynamic left ventricular outflow tract obstruction.   6. Right ventricular systolic pressure estimate = 23.5 mmHg.  Risk Assessment/Calculations:             Physical Exam:   VS:  BP 110/60   Pulse 60   Ht 5\' 9"   (1.753 m)   Wt 157 lb 6.4 oz (71.4 kg)   SpO2 97%   BMI 23.24 kg/m    Wt Readings from Last 3 Encounters:  07/02/23 157 lb 6.4 oz (71.4 kg)  04/03/23 152 lb (68.9 kg)  04/20/22 144 lb 1.6 oz (65.4 kg) (33%, Z= -0.43)*   * Growth percentiles are based on CDC (Boys, 2-20 Years) data.    GEN: Well nourished, well developed in no acute distress NECK: No JVD; No carotid bruits CARDIAC: RRR,  I/VI systolic murmur apex, without rubs or gallops RESPIRATORY:  Clear to auscultation without rales, wheezing or rhonchi  ABDOMEN: Soft, non-tender, non-distended EXTREMITIES:  No edema; No deformity   ASSESSMENT AND PLAN: .   Atypical HCM/LVH echocardiogram 04/2022 with an LVEF of 60 to 65%, apical HCM, LVH noted on EKG reviews.  Recent cardiac MRI revealed normal LV size and systolic function with an LVEF of 68%, severe apical LV wall thickness measuring up to 1.5 cm, subendocardial LGE in the apical segments (CoreValve 25% with apical wall thickness), normal RV size and systolic function, findings consistent with apical hypertrophic cardiomyopathy.  He has not been referred to hypertrophic cardiomyopathy clinic.  Discussed the importance of family screening.  Sinus bradycardia with a rate of 46 on last EKG with marked ST abnormality.  He was then placed on an event monitor that he wore for 10 days 2 hours average heart rate was noted to be 42 bpm with a minimum heart rate of 37 bpm and a maximum heart rate of 178 bpm.  The predominant underlying rhythm was sinus without atrial fibrillation or atrial flutter.  There were isolated SVE's.  No significant or sustained arrhythmias.  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         Dispo: Patient return to clinic as MD/APP in 6 months or sooner if needed for further evaluation and to ensure all  consultations have been completed.  Patient has been advised to notify the office for closer follow-up if he suffers any syncopal or near syncopal events.  He has also been encouraged to continue to follow-up with his PCP to have further routine labs completed.  Signed, Goro Wenrick, NP

## 2023-07-02 NOTE — Patient Instructions (Signed)
 Referral to EP and [facility]  Medication Instructions:  Your Physician recommend you continue on your current medication as directed.    *If you need a refill on your cardiac medications before your next appointment, please call your pharmacy*  Lab Work: No labs ordered today  If you have labs (blood work) drawn today and your tests are completely normal, you will receive your results only by: MyChart Message (if you have MyChart) OR A paper copy in the mail If you have any lab test that is abnormal or we need to change your treatment, we will call you to review the results.  Testing/Procedures: No test ordered today   Follow-Up: At [facility], you and your health needs are our priority.  As part of our continuing mission to provide you with exceptional heart care, our providers are all part of one team.  This team includes your primary Cardiologist (physician) and Advanced Practice Providers or APPs (Physician Assistants and Nurse Practitioners) who all work together to provide you with the care you need, when you need it.  Your next appointment:   6 month(s)  Provider:   Constancia Delton, MD or Ronald Cockayne, NP    We recommend signing up for the patient portal called "MyChart".  Sign up information is provided on this After Visit Summary.  MyChart is used to connect with patients for Virtual Visits (Telemedicine).  Patients are able to view lab/test results, encounter notes, upcoming appointments, etc.  Non-urgent messages can be sent to your provider as well.   To learn more about what you can do with MyChart, go to [email or web address].

## 2023-07-08 NOTE — Progress Notes (Unsigned)
 Electrophysiology Office Note:    Date:  07/09/2023   ID:  Steven [name], DOB Nov 10, 2000, MRN [id number]  [name] Cardiologist:  None  [name] Electrophysiologist:  Steven Byes, MD   Referring MD: Steven Cockayne, NP   Chief Complaint: Hypertrophic obstructive cardiomyopathy  History of Present Illness:    [name] is a 23 year old man who I am seeing today for an evaluation of hokum.  He was previously followed at [name].  He has a history of syncope.  He was seen previously by a pediatric cardiologist.  He has since established with [name].  He saw [name] in clinic Jul 02, 2023.  At that appointment, Steven patient was doing well. His episode of passing out was in Steven setting of an illness.  He is doing well today.  He confirms no known other family members with a hypertrophic cardiomyopathy diagnosis.  No family history of early sudden cardiac death.  No drowning history.  No defibrillator history in his family.  He "thinks" his mom has hypertrophic cardiomyopathy although I do not think she has ever had a diagnosis.  She does not have a defibrillator.  He has a half sister who shares Steven same father.  Steven [name] does not have any children of his own.  He reviews his episode of syncope with me.  He tells me that he was sick and had a lot of phlegm.  He was coughing up phlegm got lightheaded and dizzy.  It was not an abrupt syncopal episode.  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  He is active.  He was a healthy child and always active.  No exertional symptoms.    Their past medical, social and family history was reviewed.   ROS:   Please see Steven history of present illness.    All other systems reviewed and are negative.  EKGs/Labs/Other Studies Reviewed:    Steven following studies were reviewed today:  06/25/2023 cMR Severe apical LV hypertropohy, 1.5cm Apical LGE RV normal  05/01/2023 Zio HR 37 - 178, average 72 bpm. Rare  supaventricular and ventricular ectopy No sustained VT episodes. No atrail fibrillation   04/03/2023 ECG shows sinus rhythm. No ectopy. LVH.           Physical Exam:    VS:  BP (!) 116/54   Pulse (!) 55   Ht 5\' 9"  (1.753 m)   Wt 154 lb (69.9 kg)   SpO2 98%   BMI 22.74 kg/m     Wt Readings from Last 3 Encounters:  07/09/23 154 lb (69.9 kg)  07/02/23 157 lb 6.4 oz (71.4 kg)  04/03/23 152 lb (68.9 kg)     GEN: no distress CARD: RRR, No MRG.ICD lead malunfion RESP: No IWOB. CTAB.        ASSESSMENT AND PLAN:    1. Apical variant hypertrophic cardiomyopathy (HCC)   2. History of sinus bradycardia   3. Syncope and collapse      #Syncope Unclear cause.  Based on Steven history, I suspect his syncopal episode was vasovagal in origin.  Low suspicion for an arrhythmic syncope.    #Hypertrophic cardiomyopathy Apical variant.  Scar localized to Steven apex.  No history of arrhythmic syncope.  No history of sudden cardiac death in family.  No apical aneurysm.  Prior heart monitor without ventricular arrhythmias.  I have recommended he establish care with Dr. Paulita [name] in Steven hypertrophic cardiomyopathy clinic.  I have recommended he obtain genetic testing through Dr.  Chandrasekhar's clinic.  I discussed Steven role of defibrillators in patients with hypertrophic cardiomyopathy during today's clinic appointment.  Based on Steven AHA HCM SCD calculator, he has a 2.22% risk of SCD at 5 years yielding a class III recommendation for ICD implant.  I have recommended as needed follow-up with EP moving forward.  He will establish with Dr. Daril [name] office.    Signed, Steven [name]. Steven Slimmer, MD, [name], [name] 07/09/2023 9:43 AM    Electrophysiology [name]

## 2023-07-09 ENCOUNTER — Ambulatory Visit: Payer: Self-pay | Attending: Cardiology | Admitting: Cardiology

## 2023-07-09 ENCOUNTER — Encounter: Payer: Self-pay | Admitting: Cardiology

## 2023-07-09 VITALS — BP 116/54 | HR 55 | Ht 69.0 in | Wt 154.0 lb

## 2023-07-09 DIAGNOSIS — Z8679 Personal history of other diseases of the circulatory system: Secondary | ICD-10-CM

## 2023-07-09 DIAGNOSIS — I422 Other hypertrophic cardiomyopathy: Secondary | ICD-10-CM

## 2023-07-09 DIAGNOSIS — R55 Syncope and collapse: Secondary | ICD-10-CM

## 2023-07-09 NOTE — Patient Instructions (Signed)

## 2023-10-27 ENCOUNTER — Ambulatory Visit: Payer: Self-pay | Admitting: Genetic Counselor

## 2023-10-27 ENCOUNTER — Ambulatory Visit: Payer: Self-pay | Attending: Internal Medicine | Admitting: Internal Medicine

## 2023-10-27 VITALS — BP 118/60 | HR 74 | Ht 69.0 in | Wt 154.0 lb

## 2023-10-27 DIAGNOSIS — I493 Ventricular premature depolarization: Secondary | ICD-10-CM

## 2023-10-27 DIAGNOSIS — I422 Other hypertrophic cardiomyopathy: Secondary | ICD-10-CM

## 2023-10-27 NOTE — Progress Notes (Signed)
 Cardiology Office Note:  .    Date:  10/27/2023  ID:  Steven [name], DOB 16-Nov-2000, MRN [id number] PCP: Lorren Lauraine BRAVO, MD  [facility] Providers Cardiologist:  None Electrophysiologist:  OLE ONEIDA HOLTS, MD     CC: ApHCm care Consulted for the evaluation of LGE Scar at the behest of Dr. Darliss  History of Present Illness: .    Steven [name] is a 23 y.o. 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  He was diagnosed with apical hypertrophic cardiomyopathy two years ago and remains asymptomatic. No chest pain, shortness of breath, fatigue, dizziness, or syncope. He is physically active and regularly plays basketball.  An MRI revealed a apical scar burden with subendocardial apical scarring but no apical aneurysm. His maximal septal thickness is 15 millimeters. He has undergone evaluations by multiple cardiologists, including an MRI and a treadmill test in 2019, which he completed without significant issues [facility]). No episodes of syncope or arrhythmias.  Family history reveals no sudden cardiac deaths. He has one sister and both parents are living. There is uncertainty about whether his family members have been tested for hypertrophic cardiomyopathy, but he suspects his mother might have it.  Socially, he is a Location manager and enjoys playing basketball at local parks.  Discussed the use of AI scribe software for clinical note transcription with the patient, who gave verbal consent to proceed.  Relevant histories: .  Social  - Employment: Consulting civil engineer (studying exercise science) - Plays basketball recreationally at a park. - no FHX of SCD  ROS: As per HPI.   Studies Reviewed: .     Cardiac Studies & Procedures   ______________________________________________________________________________________________     ECHOCARDIOGRAM  ECHOCARDIOGRAM COMPLETE  04/21/2022  Narrative ECHOCARDIOGRAM REPORT    Patient Name:   Steven [name] Date of Exam: 04/21/2022 Medical Rec #:  [id number]               Height:       69.0 in Accession #:    [id number]              Weight:       144.1 lb Date of Birth:  2000/02/08               BSA:          1.797 m Patient Age:    21 years                BP:           117/61 mmHg Patient Gender: M                       HR:           62 bpm. Exam Location:  [facility]  Procedure: 2D Echo, Color Doppler, Cardiac Doppler and Intracardiac Opacification Agent  Indications:     Syncope R55  History:         Patient has no prior history of Echocardiogram examinations. Left Ventricular Hypertrophy.  Sonographer:     L. Thornton-Maynard Referring Phys:  [id number] DELAYNE LULLA SOLIAN Diagnosing Phys: Evalene Lunger MD  IMPRESSIONS   1. Left ventricular ejection fraction, by estimation, is 60 to 65%. The left ventricle has normal function. The left ventricle has no regional wall motion abnormalities. There is moderate to severe concentric left ventricular hypertrophy of the mid ventrical to apical region. Left  ventricular diastolic parameters are indeterminate. LVOT gradient 20 mm Hg 2. Right ventricular systolic function is normal. The right ventricular size is normal. There is normal pulmonary artery systolic pressure. The estimated right ventricular systolic pressure is 27.2 mmHg. 3. The mitral valve is normal in structure. Mild to moderate mitral valve regurgitation. No evidence of mitral stenosis. 4. Tricuspid valve regurgitation is moderate. 5. The aortic valve is tricuspid. Aortic valve regurgitation is not visualized. No aortic stenosis is present. 6. The inferior vena cava is normal in size with greater than 50% respiratory variability, suggesting right atrial pressure of 3 mmHg.  FINDINGS Left Ventricle: Left ventricular ejection fraction, by estimation, is 60 to 65%. The left ventricle has normal function.  The left ventricle has no regional wall motion abnormalities. Definity  contrast agent was given IV to delineate the left ventricular endocardial borders. The left ventricular internal cavity size was normal in size. There is moderate concentric left ventricular hypertrophy of the apical segment. Left ventricular diastolic parameters are indeterminate.  Right Ventricle: The right ventricular size is normal. No increase in right ventricular wall thickness. Right ventricular systolic function is normal. There is normal pulmonary artery systolic pressure. The tricuspid regurgitant velocity is 2.46 m/s, and with an assumed right atrial pressure of 3 mmHg, the estimated right ventricular systolic pressure is 27.2 mmHg.  Left Atrium: Left atrial size was normal in size.  Right Atrium: Right atrial size was normal in size.  Pericardium: There is no evidence of pericardial effusion.  Mitral Valve: The mitral valve is normal in structure. Mild to moderate mitral valve regurgitation. No evidence of mitral valve stenosis.  Tricuspid Valve: The tricuspid valve is normal in structure. Tricuspid valve regurgitation is moderate . No evidence of tricuspid stenosis.  Aortic Valve: The aortic valve is tricuspid. Aortic valve regurgitation is not visualized. No aortic stenosis is present. Aortic valve mean gradient measures 7.0 mmHg. Aortic valve peak gradient measures 12.7 mmHg. Aortic valve area, by VTI measures 2.11 cm.  Pulmonic Valve: The pulmonic valve was normal in structure. Pulmonic valve regurgitation is mild. No evidence of pulmonic stenosis.  Aorta: The aortic root is normal in size and structure.  Venous: The inferior vena cava is normal in size with greater than 50% respiratory variability, suggesting right atrial pressure of 3 mmHg.  IAS/Shunts: No atrial level shunt detected by color flow Doppler.   LEFT VENTRICLE PLAX 2D LVIDd:         4.60 cm      Diastology LVIDs:         2.30 cm       LV e' medial:    7.51 cm/s LV PW:         1.30 cm      LV E/e' medial:  11.2 LV IVS:        1.50 cm      LV e' lateral:   12.30 cm/s LVOT diam:     1.90 cm      LV E/e' lateral: 6.9 LV SV:         79 LV SV Index:   44 LVOT Area:     2.84 cm  LV Volumes (MOD) LV vol d, MOD A2C: 116.0 ml LV vol d, MOD A4C: 132.0 ml LV vol s, MOD A2C: 10.8 ml LV vol s, MOD A4C: 27.5 ml LV SV MOD A2C:     105.2 ml LV SV MOD A4C:     132.0 ml LV SV MOD BP:  106.0 ml  RIGHT VENTRICLE RV Basal diam:  3.10 cm RV S prime:     13.30 cm/s TAPSE (M-mode): 1.6 cm  LEFT ATRIUM             Index        RIGHT ATRIUM           Index LA diam:        3.40 cm 1.89 cm/m   RA Area:     14.00 cm LA Vol (A2C):   57.0 ml 31.71 ml/m  RA Volume:   35.00 ml  19.47 ml/m LA Vol (A4C):   52.8 ml 29.38 ml/m LA Biplane Vol: 58.7 ml 32.66 ml/m AORTIC VALVE                     PULMONIC VALVE AV Area (Vmax):    2.13 cm      PV Vmax:          1.27 m/s AV Area (Vmean):   1.97 cm      PV Peak grad:     6.4 mmHg AV Area (VTI):     2.11 cm      PR End Diast Vel: 4.84 msec AV Vmax:           178.00 cm/s AV Vmean:          128.000 cm/s AV VTI:            0.375 m AV Peak Grad:      12.7 mmHg AV Mean Grad:      7.0 mmHg LVOT Vmax:         134.00 cm/s LVOT Vmean:        88.800 cm/s LVOT VTI:          0.279 m LVOT/AV VTI ratio: 0.74  AORTA Ao Root diam: 3.00 cm  MITRAL VALVE               TRICUSPID VALVE MV Area (PHT): 3.68 cm    TR Peak grad:   24.2 mmHg MV Decel Time: 206 msec    TR Vmax:        246.00 cm/s MV E velocity: 84.40 cm/s MV A velocity: 54.50 cm/s  SHUNTS MV E/A ratio:  1.55        Systemic VTI:  0.28 m Systemic Diam: 1.90 cm  Evalene Lunger MD Electronically signed by Evalene Lunger MD Signature Date/Time: 04/21/2022/1:00:06 PM    Final    MONITORS  LONG TERM MONITOR (3-14 DAYS) 05/01/2023  Narrative Patch Wear Time:  10 days and 2 hours (2025-03-01T11:53:34-0500 to  2025-03-11T14:57:35-0400)  Patient had a min HR of 37 bpm, max HR of 178 bpm, and avg HR of 72 bpm. Predominant underlying rhythm was Sinus Rhythm. Isolated SVEs were rare (<1.0%), and no SVE Couplets or SVE Triplets were present. Isolated VEs were rare (<1.0%, 52), VE Triplets were rare (<1.0%, 1), and no VE Couplets were present.  Conclusion Average heart rate 72, range 37-178. No atrial fibrillation or atrial flutter. No significant or sustained arrhythmias.     CARDIAC MRI  MR CARDIAC MORPHOLOGY W WO CONTRAST 06/25/2023  Narrative CLINICAL DATA:  Apical HCM  EXAM: CARDIAC MRI  TECHNIQUE: The patient was scanned on a 1.5 Tesla Siemens magnet. A dedicated cardiac coil was used. Functional imaging was done using Fiesta sequences. 2,3, and 4 chamber views were done to assess for RWMA's. Modified Simpson's rule using a short axis stack was used to calculate an ejection  fraction on a dedicated work Research officer, trade union. The patient received 9 cc of Gadavist . After 10 minutes inversion recovery sequences were used to assess for infiltration and scar tissue. Velocity flow mapping performed in the ascending aorta and main pulmonary artery.  CONTRAST:  9 cc  of Gadavist   FINDINGS: 1. Normal left ventricular size, severe apical LV thickness, measuring 1.5 cm in diameter. normal systolic function (LVEF = 68%). There are no regional wall motion abnormalities.  There is subendocardial late gadolinium enhancement in the mid-apical segments of the left ventricular myocardium. (scar involves 25% of apical wall thickness)  LVEDV: 190 ml  LVESV: 81 ml  SV: 110 ml  CO: 5.2 L/min  Myocardial mass: 208 g  2. Normal right ventricular size, thickness and systolic function (RVEF = 57%). There are no regional wall motion abnormalities.  3.  Normal left and right atrial size.  4. Normal size of the aortic root, ascending aorta and pulmonary artery.  5.  No significant  valvular abnormalities.  6.  Normal pericardium. small pericardial effusion.  IMPRESSION: 1.  Normal LV size and systolic function.  LVEF 68%.  2.  Severe apical LV wall thickness, measuring upto 1.5 cm.  3. 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  4.  Normal RV size and systolic function.  5.  Findings consistent with apical Hypertrophic cardiomyopathy.   Electronically Signed By: Redell Cave M.D. On: 06/25/2023 16:09   ______________________________________________________________________________________________       Physical Exam:    VS:  BP 118/60   Pulse 74   Ht 5' 9 (1.753 m)   Wt 154 lb (69.9 kg)   SpO2 97%   BMI 22.74 kg/m    Wt Readings from Last 3 Encounters:  10/27/23 154 lb (69.9 kg)  07/09/23 154 lb (69.9 kg)  07/02/23 157 lb 6.4 oz (71.4 kg)    Gen: no distress  Neck: No JVD Cardiac: No Rubs or Gallops, no Murmur, RRR +2 radial pulses Respiratory: Clear to auscultation bilaterally, normal effort, normal  respiratory rate GI: Soft, nontender, non-distended  MS: No  edema;  moves all extremities Integument: Skin feels warm Neuro:  At time of evaluation, alert and oriented to person/place/time/situation  Psych: Normal affect, patient feels ok   ASSESSMENT AND PLAN: .    Hypertrophic Cardiomyopathy  - Apical Variant - without MR, Apical Aneurysm, other considerations - suspicion of Fabry's/Danon/Noonan's or other mimics of HCM: Low - Gene variant: Pending - NYHA I  - Non HCM Contributors to disease/status NA  Family history Reviewed, Discussed family screening  - discussed echo screening and potential genetic testing  SCD  Assessment - Exercise testing normal for normal rhythm and no change in blood pressure or syncope on exercise testing in 2019 OSH records - Echo from 2024 notable for no aneurysm - CMR from 2025 notable for LGE but not clearly Excessive (15% of total myocardium; I  am unable to re-process here) -  2 year assessment for VT on rhythm monitor normal in 2025 - SCD risk estimated to be slightly higher than 2.2% at 5 years SDM: we have discussed no ICD at this time - 23 year old male with apical hypertrophic cardiomyopathy and subendocardial apical scarring. No apical aneurysm present. Maximal septal thickness is 15 mm. Asymptomatic with no family history of sudden cardiac death. MRI shows 25% scar burden, but clinical suspicion suggests less extensive scarring. Risk of sudden cardiac death over five years is slightly more  than 2.2%. No current indication for defibrillator placement. - Order echocardiogram with contrast before next visit. - Refer to clinical geneticist for genetic testing (seeing now, he is uninsured and we may have to attempt this through [employer]) - Provide educational resources from the American Heart Association about hypertrophic cardiomyopathy. - Advise family members to undergo echocardiogram every five years. - Discussed exercise with patient using HCM patient and NBA played Emeline [employer] as a scaffold  Atrial fibrillation Assessment  - HCM-AF score 14 - Atrial arrhythmia management: less likely that NSVT, monitoring plan as above  Premature ventricular contractions in the setting of apical hypertrophic cardiomyopathy Premature ventricular contractions (PVCs) in the setting of apical hypertrophic cardiomyopathy. Previous treadmill test in 2019 showed one solitary PVC, deemed non-concerning. No current symptoms or concerning heart rhythms detected. - Monitor for PVCs and other arrhythmias during treadmill stress test. - Reassess rhythm every 2-3 years or sooner if symptomatic.   GENETIC TESTING COUNSELING EVALUATION:  I met with the patient today to discuss cardiac genetic testing. We reviewed the following information:  INDICATION FOR TESTING: * confirmed diagnosis of ApHCM  TESTING OPTION(S) DISCUSSED: * blood  testing  BENEFITS OF GENETIC TESTING DISCUSSED:  * May guide treatment decisions and medical management (potentially, only if 2A ICD indication) * May provide prognostic information * May identify at-risk family members who could benefit from screening * May avoid unnecessary testing or interventions for family members who test negative * May provide information for family planning (no kids presently)  LIMITATIONS AND RISKS DISCUSSED:  * Testing may not identify a genetic cause, even if one exists * Testing may identify a variant of uncertain significance (VUS) * Results may change over time as more information becomes available * Potential for unexpected or secondary findings * Possible psychological impact of positive or negative results * Possible discrimination concerns  FINANCIAL CONSIDERATIONS DISCUSSED:  * currently no active free testing programs available  LIFE INSURANCE/DISABILITY INSURANCE IMPLICATIONS:  * GINA protection applies to health insurance and employment but not life, disability, or long-term care insurance * Recommendation to consider securing insurance prior to testing if concerned * Documentation considerations for insurance applications  FAMILY IMPLICATIONS: * Cascade testing approach for relatives if positive result * Communication strategies with family members * Resources for family discussions  PATIENT UNDERSTANDING AND DECISION:  * Patient demonstrated understanding of the above information * Patient had opportunity to ask questions which were addressed * Patient decision: proceed with testing, seeing genetics now  Time Spent Directly with Patient:   I have spent a total of 55 minutes with the patient reviewing notes, imaging, EKGs, labs,  and examining the patient as well as establishing an assessment and plan that was discussed personally with the patient. Discussed disease state education , using shared decision making tools and cardiac modeling ,  and SCD risk.  . Reviewed care and plan in collaboration with clinical genetics   Stanly Leavens, MD FASE [facility] Cardiologist [facility]  [address] [place], [address] [place], [place] [address] [phone]  1:59 PM

## 2023-10-27 NOTE — Patient Instructions (Signed)
 Medication Instructions:  Your physician recommends that you continue on your current medications as directed. Please refer to the Current Medication list given to you today.  *If you need a refill on your cardiac medications before your next appointment, please call your pharmacy*  Lab Work: NONE  If you have labs (blood work) drawn today and your tests are completely normal, you will receive your results only by: MyChart Message (if you have MyChart) OR A paper copy in the mail If you have any lab test that is abnormal or we need to change your treatment, we will call you to review the results.  Testing/Procedures: Your physician has requested that you have an exercise tolerance test. For further information please visit [email or web address] Please also follow instructions, as given.    DO NOT TAKE YOUR ____N/A________________. Do not eat, drink or use tobacco products 4 hour prior to the test. Dress prepared to exercise in a comfortable, two piece clothing outfit and walking shoes. Do not wear any perfume, cologne, aftershave or lotion. Bring any current prescription medications with you the day of the test. Notify the office 24 hours in advance if you cannot keep this appointment. If you have any questions please call [phone].     Follow-Up: At [facility], you and your health needs are our priority.  As part of our continuing mission to provide you with exceptional heart care, our providers are all part of one team.  This team includes your primary Cardiologist (physician) and Advanced Practice Providers or APPs (Physician Assistants and Nurse Practitioners) who all work together to provide you with the care you need, when you need it.  Your next appointment:   1 year(s)  Provider:   Stanly Leavens, MD or Orren Fabry, [place]

## 2023-10-29 ENCOUNTER — Telehealth: Payer: Self-pay | Admitting: Licensed Clinical Social Worker

## 2023-10-29 NOTE — Progress Notes (Unsigned)
 Heart and Vascular Care Navigation  10/29/2023  Steven [name] 2001-01-04 [id number]  Reason for Referral: self pay   Engaged with patient by telephone for initial visit for Heart and Vascular Care Coordination.                                                                                                   Assessment:                    LCSW was able to reach pt at [phone]; introduced self, role, reason for call. Pt confirmed home address and DOB. Pt resides with his mother and two aunts. 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 Denies any issues with food, transportation or housing costs. With his current income he is over income likely for Medicaid. Discussed if over income for Medicaid then recommend completing patient assistance for [facility] or looking into Marketplace coverage if interested. Pt currently on no medications but we discussed if any medications recommended and too expensive to reach out to our clinic. Pt agreeable, no additional questions at this time.                   HRT/VAS Care Coordination     Patients Home Cardiology Office [facility]   Outpatient Care Team Social Worker   Social Worker Name: Marit Lark, [place], [phone]   Living arrangements for the past 2 months Single Family Home   Lives with: Relatives; Parents   Patient Current Insurance Coverage Self-Pay   Patient Has Concern With Paying Medical Bills Yes   Patient Concerns With Medical Bills self pay, recent medical bills   Medical Bill Referrals: with current income likely not eligible for   Does Patient Have Prescription Coverage? No   Home Assistive Devices/Equipment None       Social History:                                                                             SDOH Screenings   Food Insecurity: No Food Insecurity (10/29/2023)  Housing: Low Risk  (10/29/2023)  Transportation Needs: No Transportation Needs (10/29/2023)  Utilities: Not At  Risk (04/20/2022)  Financial Resource Strain: Low Risk  (10/29/2023)  Tobacco Use: Low Risk  (07/09/2023)  Health Literacy: Adequate Health Literacy (10/29/2023)    SDOH Interventions: Financial Resources:  Surveyor, quantity Strain Interventions: Artist, Other (Comment) Futures trader, medicaid, and CAFA info sent to pt) DSS for financial assistance and Editor, commissioning for [employer] Program  Food Insecurity:  Food Insecurity Interventions: Intervention Not Indicated  Housing Insecurity:  Housing Interventions: Intervention Not Indicated  Transportation:   Transportation Interventions: Intervention Not Indicated   Other Care Navigation Interventions:     Provided Pharmacy assistance resources  Currently  pt on no medications; discussed if ever unaffordable/remains self pay to contact office for any assistance options   Follow-up plan:   LCSW has mailed pt my card, Medicaid flyer, CAFA application and Producer, television/film/video. Will f/u to ensure pt has received this and answer any other questions that may arise.

## 2023-10-30 ENCOUNTER — Encounter (HOSPITAL_COMMUNITY): Payer: Self-pay

## 2023-11-04 NOTE — Progress Notes (Signed)
 Pre Test Genetic Consult  Referral Reason  Steven [name], a new HCM patient, is referred for genetic consult and testing of hypertrophic cardiomyopathy.   Personal Medical Information Steven [name] (III.1 on pedigree) is a 23 year old African American who works in Presenter, broadcasting at [employer]. In 2018, he was referred for ear tubes but upon evaluation was found to have an abnormal heart beat. He underwent cardiac screening at age 67 that was suggestive of HCM. He reports being asymptomatic at that time and followed up at [facility].  An echocardiogram (3/24) demonstrated severe LVH of mid-apical region and recent cardiac MRI (06/25/23) detected apical wall thickening of 1.5 cm, LVEF 68% and scar burden of 25%.  He continues to be asymptomatic and denies having dyspnea, chest discomfort, heart palpitations, dizziness or syncope.   Traditional Risk Factors Steven [name] denies having HTN or aortic valve stenosis that can also lead to cardiac hypertrophy.   Family history  Relation to Proband Pedigree # Current age Heart condition/age of onset Notes  Children None     Siblings None           Father II.6 50 None Echo/EKG to be done  Paternal uncles, 4 II.2-II.5 Deceased, 70s None II.2- Died of ? @ 71  Paternal aunt II.1 63 None   Paternal grandfather 1.1 Deceased None Died @ ? of ?  Paternal grandmother I.2 Deceased None Died @ ? of ?        Mother Steven [name], 06/08/1966) II.7 56 LVH, bradycardia ESRD @ 51, CVA> aphasia  Maternal aunts, 3 II.8-II.10 60s, Deceased None II.10- Died @ 25s- lung disease, ESRD  Maternal uncles, 4 II.11-II.4 Deceased, 60s-70s None II.11- Died @ 40s- aspiration II.12- Died @ ? of ?  Maternal grandfather I.3 Deceased None Died @ ? of ?  Maternal grandmother I.4 Deceased None Died @ ? of ?   Genetics Steven [name] was counseled on the genetics of hypertrophic cardiomyopathy (HCM). I explained to the patient that this is an autosomal dominant condition with incomplete penetrance  i.e. not all individuals harboring the HCM mutation will present clinically with HCM, and age-related penetrance where clinical presentation of HCM increases with advanced age. Variability in clinical expression is also seen in families with HCM with affected family members presenting clinically at different ages and with symptoms ranging from mild to severe.  Since HCM is an autosomal dominant condition, first degree-relatives are at a 50% risk of inheriting this condition. They should seek regular surveillance for HCM.  First-degree relatives include his parents, and future children.   Clinical screening of first-degree relatives involves echocardiogram and EKG at regular intervals, frequency is typically determined by age, with children undergoing screening every year until the age of 66 and those over the age of 55 getting screened every 3-5 years until the age of 66. Patient verbalized understanding of this.  Also briefly discussed the inheritance pattern and treatment /management plans for the infiltrative cardiomyopathies that present as HCM phenocopies. About 8-10% of HCM patients can have compound and digenic sarcomeric mutations for HCM  Patient should be aware that genetic testing is a probabilistic test dependent upon age and severity of presentation, presence of risk factors for HCM and importantly family history of HCM or sudden death in first-degree relatives. The potential outcomes of genetic testing and subsequent management of at-risk family members is listed below-  If a mutation is not identified then it is important that he understands that HCM is a genetic condition and can be  passed down to his children. All first-degree relatives should undergo regular screening for HCM.  A negative test result can be due to limitations of the genetic test.   There is also the likelihood of identifying a "Variant of unknown significance". This result means that the variant has not been detected in  a statistically significant number of HCM patients and/or functional studies have not been performed to verify its pathogenicity. This VUS can be tested in the family to see if it segregates with disease. If a VUS is found, first-degree relatives should undergo regular clinical screening for HCM, but genetic testing for the VUS is otherwise not warranted.  If a pathogenic variant is reported, then first-degree family members can get tested for this variant. If they test positive, it is likely they will develop HCM. In light of variable expression and incomplete penetrance associated with HCM, it is not possible to predict when they will manifest clinically with HCM. It is recommended that family members that test positive for the familial pathogenic variant pursue clinical screening for HCM. Family members that test negative for the familial mutation need not pursue periodic screening for HCM, but seek care if symptoms develop.   Impression  Steven [name] was found to have cardiac wall thickness suggestive of HCM at age 39 in the absence of other cardiac loading conditions that can lead to cardiac hypertrophy. There is no family history of HCM or sudden death. It is likely he has a de novo mutation for HCM or has inherited this from one of his parents with evident reduced penetrance.  Genetic testing is recommended to confirm his diagnosis. This test should include the major sarcomeric genes involved in HCM, namely MYBPPC3, MYH7, TNNI3, TNNT2, TPM1, ACTC1, MYL2 and MYL3. It should also include the genes involved in HCM phenocopies as cardiac-predominant forms of these conditions present clinically as HCM. These include genes for Fabry disease (GLA), Danon disease (LAMP2), WPW syndrome (PRKAG2), Familial transthyretin amyloidosis (TTR), phospholamban (PLN) and desmin (DES).  In addition, patient should be aware of protections afforded by the Genetic Information Non-Discrimination Act (GINA). GINA protects a patient  from losing their employment or health insurance based on their genotype. However, these protections do not cover life insurance and disability. Explained to the patient that family members that are found to have the familial genetic mutation will be denied life insurance even if they are asymptomatic and do not exhibit clinical signs of HCM. Patient verbalized understanding and will discuss this with their family.    Please note that the patient has not been counseled in this visit on other personal, cultural, or ethical issues that they may face due to their heart condition.   Plan After a thorough discussion of the risk and benefits of genetic testing for HCM, Steven [name] declines genetic testing due to insurance non-coverage for the HCM test. He plans to pursue this when he has insurance at the upcoming enrollment window.  He will discuss HCM screening guidelines and procuring life insurance with his family.     Danford Pac, Ph.D, [facility] Clinical Molecular Geneticist

## 2023-11-07 ENCOUNTER — Encounter (HOSPITAL_COMMUNITY): Payer: Self-pay | Admitting: *Deleted

## 2023-11-11 ENCOUNTER — Telehealth: Payer: Self-pay | Admitting: Licensed Clinical Social Worker

## 2023-11-11 NOTE — Telephone Encounter (Signed)
 H&V Care Navigation CSW Progress Note  Clinical Social Worker contacted patient by phone to f/u on assistance information mailed to him. Reached him at [phone]. Pt shares he hasn't been home to check mail but his mom told him something came for him and he will review it when he is home. Encouraged him to call me, will f/u if I dont hear from him. No additional questions/concerns at this time.  Patient is participating in a Managed Medicaid Plan:  No, self pay only  SDOH Screenings   Food Insecurity: No Food Insecurity (10/29/2023)  Housing: Low Risk  (10/29/2023)  Transportation Needs: No Transportation Needs (10/29/2023)  Utilities: Not At Risk (04/20/2022)  Financial Resource Strain: Low Risk  (10/29/2023)  Tobacco Use: Low Risk  (07/09/2023)  Health Literacy: Adequate Health Literacy (10/29/2023)    Marit Lark, MSW, LCSW Clinical Social Worker II [facility] Health Heart/Vascular Care Navigation  [phone]- work cell phone (preferred)

## 2023-11-14 ENCOUNTER — Ambulatory Visit (HOSPITAL_COMMUNITY)
Admission: RE | Admit: 2023-11-14 | Discharge: 2023-11-14 | Disposition: A | Discharge status: Home / Self Care | Payer: Self-pay | Source: Ambulatory Visit | Attending: Cardiology | Admitting: Cardiology

## 2023-11-14 DIAGNOSIS — I422 Other hypertrophic cardiomyopathy: Secondary | ICD-10-CM | POA: Insufficient documentation

## 2023-11-14 LAB — EXERCISE TOLERANCE TEST
Angina Index: 0
Duke Treadmill Score: 15
Estimated workload: 17.2
Exercise duration (min): 15 min
Exercise duration (sec): 0 s
MPHR: 199 {beats}/min
Peak HR: 181 {beats}/min
Percent HR: 90 %
Rest HR: 72 {beats}/min
ST Depression (mm): 0 mm

## 2023-11-17 ENCOUNTER — Ambulatory Visit: Payer: Self-pay

## 2023-11-19 ENCOUNTER — Telehealth: Payer: Self-pay | Admitting: Licensed Clinical Social Worker

## 2023-11-19 NOTE — Telephone Encounter (Signed)
 H&V Care Navigation CSW Progress Note  Clinical Social Worker contacted patient by phone to f/u on patient assistance forms. No answer today at [phone]. Left voicemail, will f/u again as able.  Patient is participating in a Managed Medicaid Plan:  No, self pay only  SDOH Screenings   Food Insecurity: No Food Insecurity (10/29/2023)  Housing: Low Risk  (10/29/2023)  Transportation Needs: No Transportation Needs (10/29/2023)  Utilities: Not At Risk (04/20/2022)  Financial Resource Strain: Low Risk  (10/29/2023)  Tobacco Use: Low Risk  (07/09/2023)  Health Literacy: Adequate Health Literacy (10/29/2023)    Marit Lark, MSW, LCSW Clinical Social Worker II [facility] Health Heart/Vascular Care Navigation  [phone]- work cell phone (preferred)

## 2023-12-04 ENCOUNTER — Encounter (HOSPITAL_BASED_OUTPATIENT_CLINIC_OR_DEPARTMENT_OTHER): Payer: Self-pay
